# Patient Record
Sex: Male | Born: 1960 | Race: White | Hispanic: No | Marital: Married | Smoking: Never smoker
Health system: Southern US, Community
[De-identification: ages and names within clinical notes are randomized; demographics above are authoritative.]

---

## 2018-11-21 ENCOUNTER — Other Ambulatory Visit: Payer: Self-pay | Admitting: Internal Medicine

## 2018-11-21 DIAGNOSIS — R519 Headache, unspecified: Secondary | ICD-10-CM

## 2018-11-21 DIAGNOSIS — R51 Headache: Principal | ICD-10-CM

## 2018-12-07 ENCOUNTER — Ambulatory Visit: Admission: RE | Admit: 2018-12-07 | Payer: BLUE CROSS/BLUE SHIELD | Source: Ambulatory Visit

## 2020-05-09 ENCOUNTER — Other Ambulatory Visit: Payer: Self-pay | Admitting: Internal Medicine

## 2020-05-09 DIAGNOSIS — Z982 Presence of cerebrospinal fluid drainage device: Secondary | ICD-10-CM

## 2020-05-09 DIAGNOSIS — R29810 Facial weakness: Secondary | ICD-10-CM

## 2020-05-09 DIAGNOSIS — H539 Unspecified visual disturbance: Secondary | ICD-10-CM

## 2020-05-09 DIAGNOSIS — Z86018 Personal history of other benign neoplasm: Secondary | ICD-10-CM

## 2020-05-22 ENCOUNTER — Ambulatory Visit
Admission: RE | Admit: 2020-05-22 | Discharge: 2020-05-22 | Disposition: A | Discharge status: Home / Self Care | Payer: 59 | Source: Ambulatory Visit | Attending: Internal Medicine | Admitting: Internal Medicine

## 2020-05-22 ENCOUNTER — Other Ambulatory Visit: Payer: Self-pay

## 2020-05-22 DIAGNOSIS — Z9889 Other specified postprocedural states: Secondary | ICD-10-CM | POA: Diagnosis present

## 2020-05-22 DIAGNOSIS — Z86018 Personal history of other benign neoplasm: Secondary | ICD-10-CM | POA: Insufficient documentation

## 2020-05-22 DIAGNOSIS — Z982 Presence of cerebrospinal fluid drainage device: Secondary | ICD-10-CM | POA: Diagnosis present

## 2020-05-22 DIAGNOSIS — H539 Unspecified visual disturbance: Secondary | ICD-10-CM | POA: Diagnosis present

## 2020-05-22 DIAGNOSIS — R29810 Facial weakness: Secondary | ICD-10-CM | POA: Diagnosis present

## 2020-07-02 LAB — EXTERNAL GENERIC LAB PROCEDURE: COLOGUARD: NEGATIVE

## 2020-10-07 IMAGING — MR MR HEAD W/O CM
11 series · 46 of 48 positions shown · non-contrast
Comparison: None.

CLINICAL DATA: History of acoustic neuroma resection. Shunt. Visual
disturbance. Craniotomy 5986.

EXAM:
MRI HEAD WITHOUT CONTRAST
TECHNIQUE: Multiplanar, multiecho pulse sequences of the brain and surrounding
structures were obtained without intravenous contrast.

[Series 5: ax dwi_tracew · axial · 3.0mm · 0.60mm/px · z∈[-127,+27]mm · 3 of 48 slices shown]
[im 1/48]
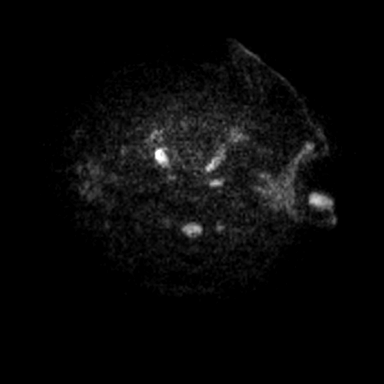
[im 24/48]
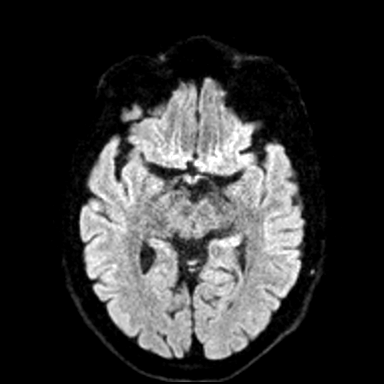
[im 48/48]
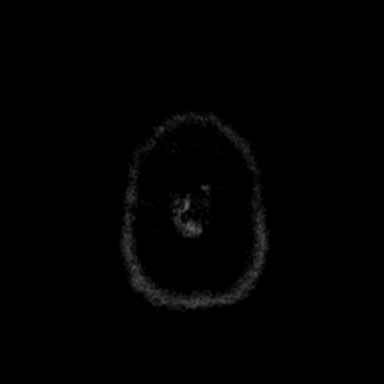

[Series 6: ax dwi_adc · axial · 3.0mm · 0.60mm/px · z∈[-127,+27]mm · 4 of 48 slices shown]
[im 1/48]
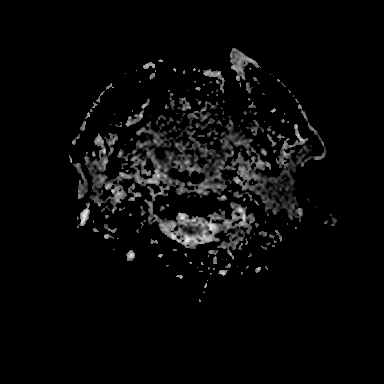
[im 16/48]
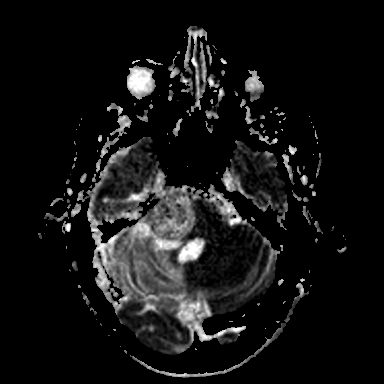
[im 32/48]
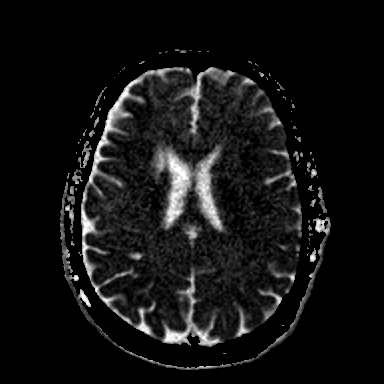
[im 48/48]
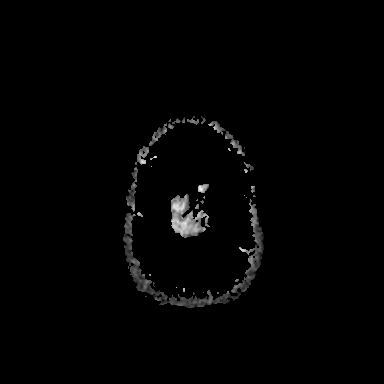

[Series 8: cor dwi_adc · coronal · 5.0mm · 0.60mm/px · 3 of 36 slices shown]
[im 1/36]
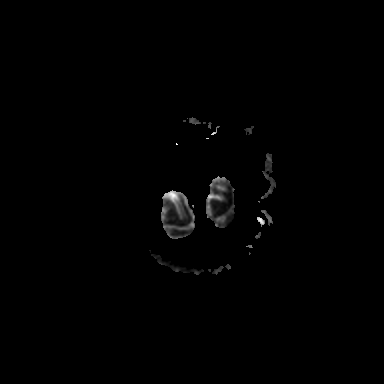
[im 18/36]
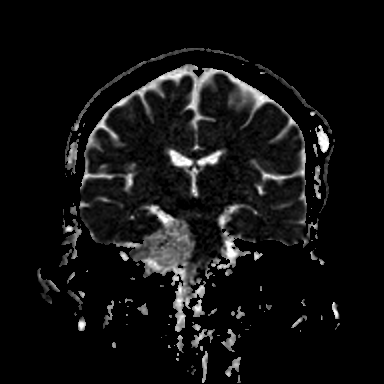
[im 36/36]
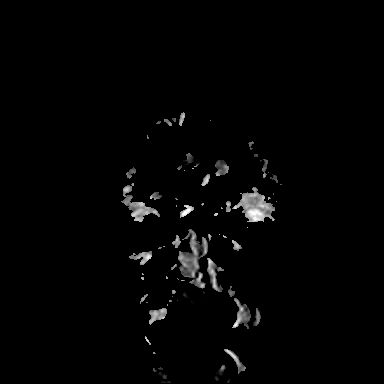

[Series 9: T1 · sagittal · 5.0mm · 0.62mm/px · 2 of 21 slices shown (1 of 2)]
[im 1/21]
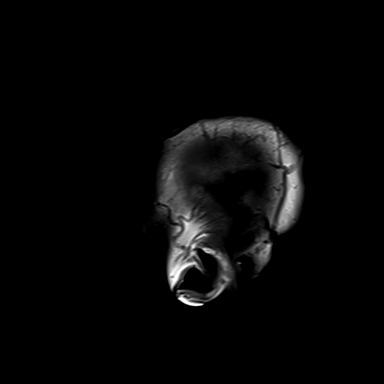
[im 21/21]
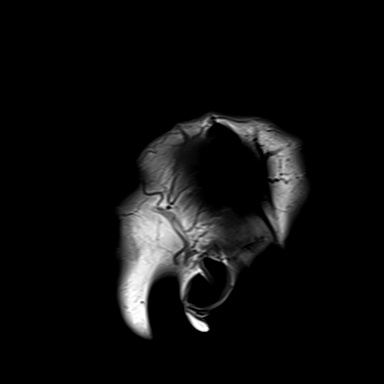

[Series 10: T2 · axial · 5.0mm · 0.53mm/px · z∈[-119,+24]mm · 2 of 25 slices shown (1 of 2)]
[im 1/25]
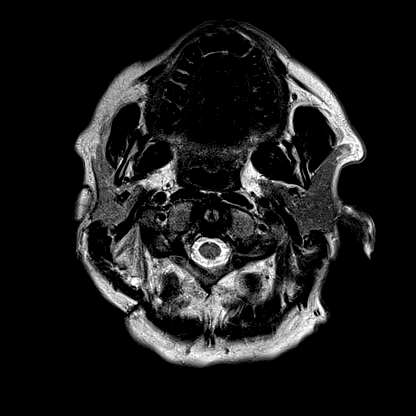
[im 25/25]
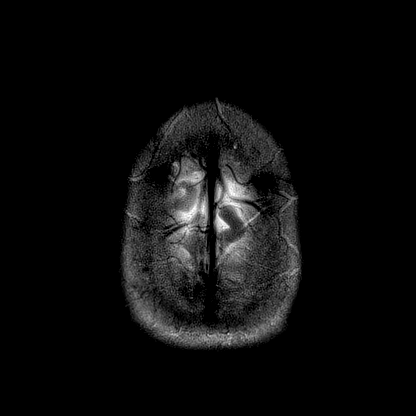

[Series 11: mag_images · axial · 3.0mm · 0.90mm/px · z∈[-135,+41]mm · 5 of 60 slices shown]
[im 1/60]
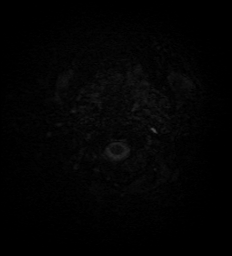
[im 15/60]
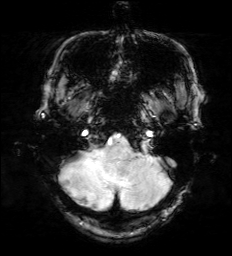
[im 30/60]
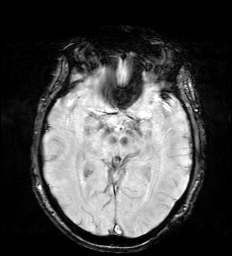
[im 45/60]
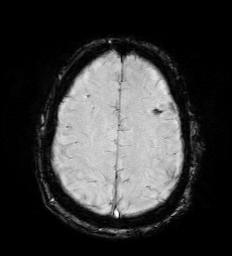
[im 60/60]
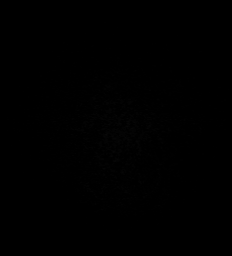

[Series 12: pha_images · axial · 3.0mm · 0.90mm/px · z∈[-135,+41]mm · 5 of 59 slices shown]
[im 1/59]
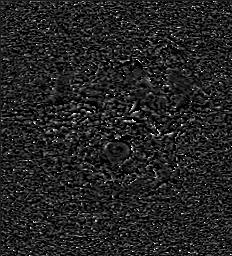
[im 15/59]
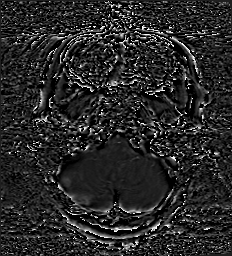
[im 30/59]
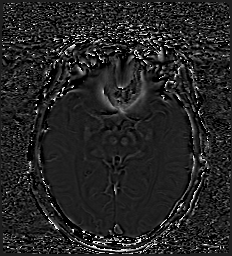
[im 44/59]
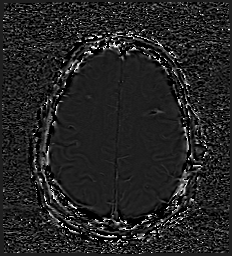
[im 59/59]
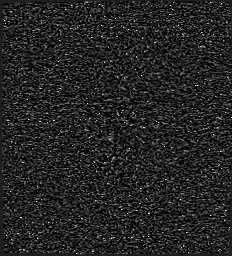

[Series 13: swi_images · axial · 3.0mm · 0.90mm/px · z∈[-135,+41]mm · 5 of 60 slices shown]
[im 1/60]
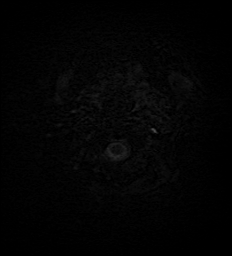
[im 15/60]
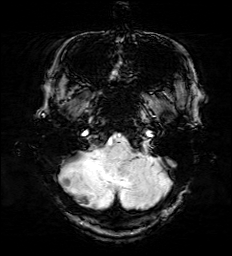
[im 30/60]
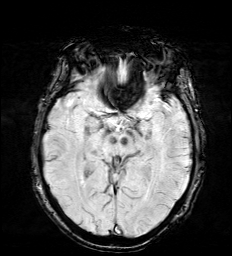
[im 45/60]
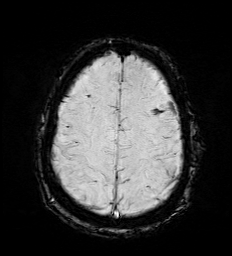
[im 60/60]
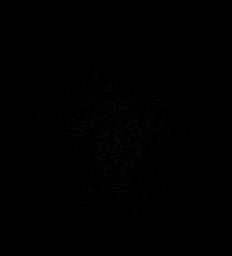

[Series 15: FLAIR · axial · 3.0mm · 0.53mm/px · z∈[-128,+33]mm · 4 of 55 slices shown]
[im 1/55]
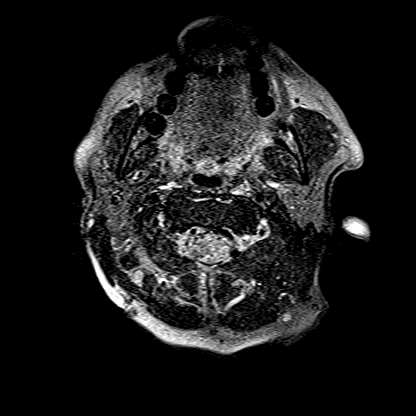
[im 19/55]
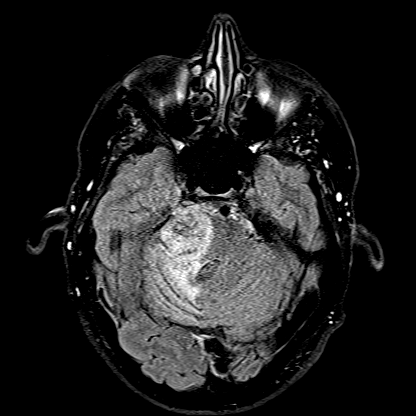
[im 37/55]
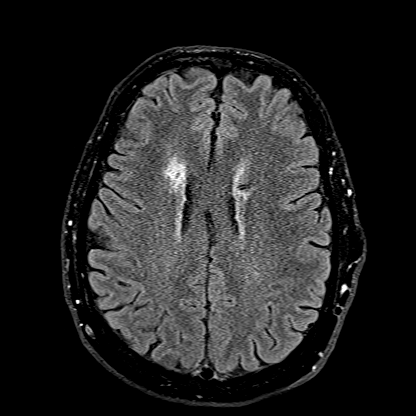
[im 55/55]
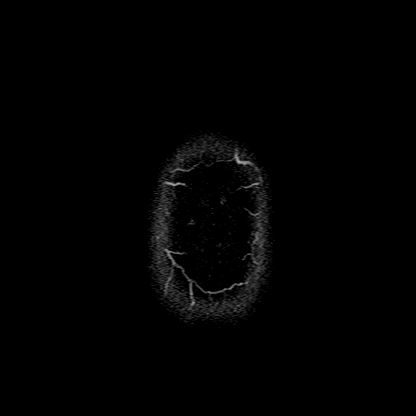

[Series 16: T1 · axial · 1.0mm · 0.98mm/px · z∈[-126,+32]mm · 11 of 160 slices shown (2 of 2)]
[im 1/160]
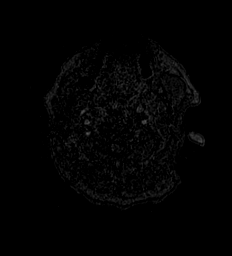
[im 14/160]
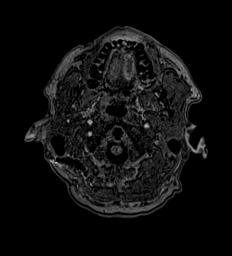
[im 27/160]
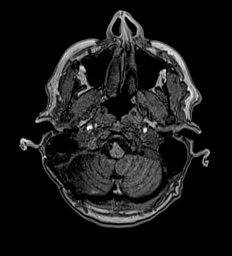
[im 40/160]
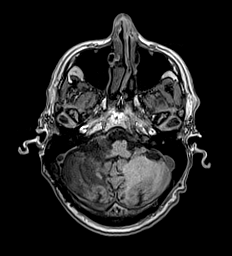
[im 54/160]
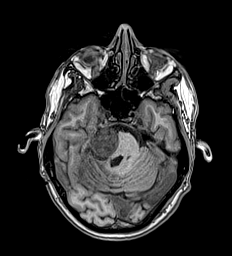
[im 67/160]
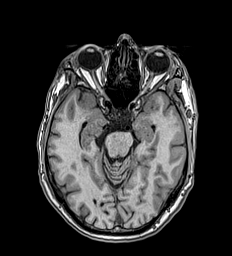
[im 80/160]
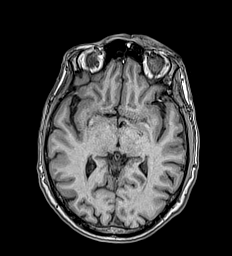
[im 93/160]
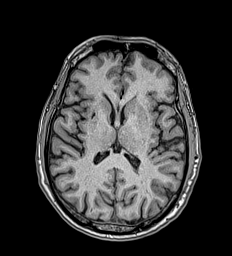
[im 107/160]
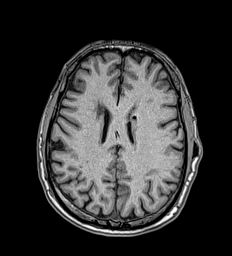
[im 133/160]
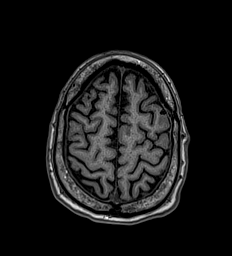
[im 160/160]
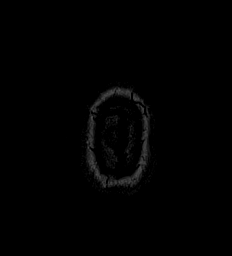

[Series 17: T2 · coronal · 5.0mm · 0.57mm/px · 2 of 27 slices shown (2 of 2)]
[im 1/27]
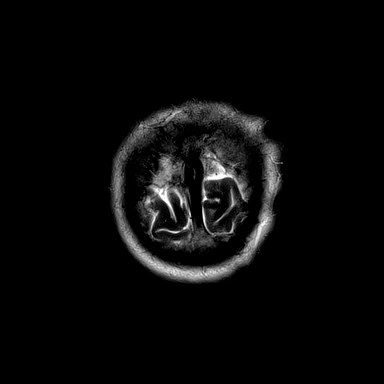
[im 27/27]
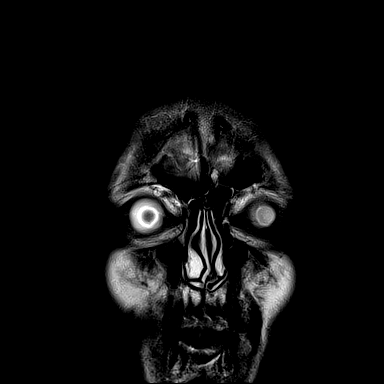

[46 of 48 positions shown; findings below may reference images not displayed]

FINDINGS: Brain: Large mass lesion is present in the right cerebellar pontine
angle cistern. This mass measures approximately 2.3 x 2.8 cm and
contains calcification. There is considerable mass-effect on the
pons which is flattened and displaced to the left. Mass is seen
extending into the right internal auditory canal. There is been
prior right occipital craniotomy. There is chronic encephalomalacia
throughout much of the right cerebellum.

Left frontal shunt catheter in the left lateral ventricle. Ventricle
size normal. Retired shunt tract in the right frontal lobe. Mild
chronic white matter ischemia. No acute infarct.

Vascular: Normal arterial flow voids

Skull and upper cervical spine: Right occipital craniotomy. No acute
skeletal abnormality.

Sinuses/Orbits: Mild mucosal edema paranasal sinuses. Mild right
mastoid effusion. Negative orbit

Other: None
IMPRESSION: 2.3 x 2.8 cm mass in the right cerebellar pontine angle cistern
extending into the right IAC consistent with vestibular schwannoma.
There is extensive mass-effect on the pons without significant edema
in the pons. There is extensive encephalomalacia in the right
cerebellum from prior surgery.

Shunt catheter in the left lateral ventricle.  No hydrocephalus.

## 2020-10-23 ENCOUNTER — Ambulatory Visit: Payer: 59 | Attending: Emergency Medicine | Admitting: Physical Therapy

## 2020-10-23 ENCOUNTER — Other Ambulatory Visit: Payer: Self-pay

## 2020-10-23 DIAGNOSIS — R41844 Frontal lobe and executive function deficit: Secondary | ICD-10-CM | POA: Diagnosis present

## 2020-10-23 DIAGNOSIS — R2689 Other abnormalities of gait and mobility: Secondary | ICD-10-CM | POA: Insufficient documentation

## 2020-10-23 DIAGNOSIS — M25611 Stiffness of right shoulder, not elsewhere classified: Secondary | ICD-10-CM | POA: Diagnosis present

## 2020-10-23 DIAGNOSIS — R29818 Other symptoms and signs involving the nervous system: Secondary | ICD-10-CM | POA: Insufficient documentation

## 2020-10-23 DIAGNOSIS — R41842 Visuospatial deficit: Secondary | ICD-10-CM | POA: Diagnosis present

## 2020-10-23 DIAGNOSIS — R41840 Attention and concentration deficit: Secondary | ICD-10-CM | POA: Diagnosis present

## 2020-10-23 DIAGNOSIS — M25511 Pain in right shoulder: Secondary | ICD-10-CM | POA: Diagnosis present

## 2020-10-23 DIAGNOSIS — R42 Dizziness and giddiness: Secondary | ICD-10-CM

## 2020-10-23 DIAGNOSIS — R2681 Unsteadiness on feet: Secondary | ICD-10-CM | POA: Insufficient documentation

## 2020-10-23 DIAGNOSIS — M6281 Muscle weakness (generalized): Secondary | ICD-10-CM | POA: Diagnosis present

## 2020-10-23 DIAGNOSIS — R278 Other lack of coordination: Secondary | ICD-10-CM | POA: Insufficient documentation

## 2020-10-23 DIAGNOSIS — R262 Difficulty in walking, not elsewhere classified: Secondary | ICD-10-CM | POA: Diagnosis present

## 2020-10-23 NOTE — Therapy (Addendum)
[facility] [address] [place], [place], [address] Phone: [phone]   Fax:  [phone]  Physical Therapy Evaluation  Patient Details  Name: [name] MRN: [id number] Date of Birth: 1961-04-11 Referring Provider (PT): Nelwyn Salisbury, [place]   Encounter Date: 10/23/2020   PT End of Session - 10/23/20 1707    Visit Number 1    Number of Visits 13    Authorization Type [employer] - 30 VL between PT and OT (will start over at beginning of year)    Authorization - Number of Visits 30    PT Start Time 1448    PT Stop Time 1540    PT Time Calculation (min) 52 min    Equipment Utilized During Treatment Gait belt    Activity Tolerance Patient tolerated treatment well    Behavior During Therapy [facility] for tasks assessed/performed           No past medical history on file.    There were no vitals filed for this visit.    Subjective Assessment - 10/23/20 1456    Subjective Pt with right acoustic neuroma, s/p partial resection and VP shunt placement in 2013 (pt reporting having a CVA afterwards and had to learn to walk/speak again - unable to see these records as pt was in [place]), followed by radiation therapy in 2017, presenting for repeat resection due to worsening diplopia.  Moved to [place] in 2018. On 10/08/20 underwent resection of R acoustic neuroma (all of it was removed).  Pt reports doing well after the surgery, reports balance is much better. Before the surgery was having incr double vision. Having surgery on his eyelid on the 13th of December to help it close better.  Reports occassional dizziness, more in stressful situations(like in a busy crowd). No falls. Reports difficulties with fine motor skills with R hand - wishes to be able to use his R hand again.    Patient is accompained by: Family member   wife, [name]   Pertinent History R acoustic neuroma (s/p resection on 10/08/20), past medical history of PE  (in 2016, off DOAC) hypercholesteremia, hypertension    Patient Stated Goals wants to be able to move better and walk better, more fluid pattern.    Currently in Pain? 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  No    Is the patient reluctant to leave their home because of a fear of falling?  No      Home Tourist information centre manager residence    Living Arrangements Spouse/significant other;Other (Comment)   and 60 year old grandduaghter   Type of Home House  Home Access Stairs to enter    Entrance Stairs-Number of Steps 5    Entrance Stairs-Rails Can reach both;Right   R side getting up in garage   Home Layout Two level    Alternate Level Stairs-Number of Steps --   standard flight of stairs   Alternate Level Stairs-Rails Left    Home Equipment Other (comment)   stool in shower (has not needed it)     Prior Function   Level of Independence Independent    Vocation Requirements 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)    Leisure gardening, yard work       Nutritional therapist   Gross Motor Movements are Fluid and Coordinated No    Psychiatric nurse difficulty with RLE>LLE      ROM / Strength   AROM / PROM / Strength Strength      Strength    Strength Assessment Site Hip;Knee;Ankle    Right/Left Hip Right;Left    Right Hip Flexion 5/5    Left Hip Flexion 5/5    Right/Left Knee Right;Left    Right Knee Flexion 5/5    Right Knee Extension 5/5    Left Knee Flexion 4+/5    Left Knee Extension 5/5    Right/Left Ankle Right;Left    Right Ankle Dorsiflexion 5/5    Left Ankle Dorsiflexion 5/5      Transfers   Transfers Sit to Stand;Stand to Sit    Sit to Stand 5: Supervision    Five time sit to stand comments  with no UE support:  16.34 seconds     Stand to Sit 5: Supervision      Ambulation/Gait   Ambulation/Gait Yes    Ambulation/Gait Assistance 5: Supervision;4: Min guard    Ambulation/Gait Assistance Details needing assist on pt's R due to pt wearing eye patch and decr vision    Assistive device None    Gait Pattern Step-through pattern;Decreased arm swing - right;Decreased arm swing - left    Ambulation Surface Level;Indoor    Gait velocity 5.45 seconds in 20 ft = 3.66 ft/sec       High Level Balance   High Level Balance Comments mCTSIB (with feet together): condition 1-3: 30 seconds, condition 4: 2.3 seconds, SLS: 2 seconds B      Functional Gait  Assessment   Gait assessed  Yes    Gait Level Surface Walks 20 ft in less than 5.5 sec, no assistive devices, good speed, no evidence for imbalance, normal gait pattern, deviates no more than 6 in outside of the 12 in walkway width.   5.45   Change in Gait Speed Able to smoothly change walking speed without loss of balance or gait deviation. Deviate no more than 6 in outside of the 12 in walkway width.    Gait with Horizontal Head Turns Performs head turns smoothly with slight change in gait velocity (eg, minor disruption to smooth gait path), deviates 6-10 in outside 12 in walkway width, or uses an assistive device.    Gait with Vertical Head Turns Performs task with slight change in gait velocity (eg, minor disruption to smooth gait path), deviates 6 - 10 in outside 12 in  walkway width or uses assistive device    Gait and Pivot Turn Pivot turns safely within 3 sec and stops quickly with no loss of balance.    Step  Over Obstacle Is able to step over 2 stacked shoe boxes taped together (9 in total height) without changing gait speed. No evidence of imbalance.    Gait with Narrow Base of Support Ambulates less than 4 steps heel to toe or cannot perform without assistance.    Gait with Eyes Closed Walks 20 ft, slow speed, abnormal gait pattern, evidence for imbalance, deviates 10-15 in outside 12 in walkway width. Requires more than 9 sec to ambulate 20 ft.   veers to R   Ambulating Backwards Walks 20 ft, uses assistive device, slower speed, mild gait deviations, deviates 6-10 in outside 12 in walkway width.    Steps Alternating feet, must use rail.    Total Score 21    FGA comment: 21/30                      Objective measurements completed on examination: See above findings.               PT Education - 10/23/20 1707    Education Details clinical findings, POC, fall risk.    Person(s) Educated Patient;Spouse    Methods Explanation    Comprehension Verbalized understanding            PT Short Term Goals - 10/24/20 0939      PT SHORT TERM GOAL #1   Title Pt will be independent with initial HEP in order to build upon functional gains made in therapy. ALL STGS DUE 11/14/20    Time 3    Period Weeks    Status New    Target Date 11/14/20      PT SHORT TERM GOAL #2   Title Pt will undergo further assessment of SOT - with LTG written as appropriate.    Baseline 3    Period Weeks    Status New      PT SHORT TERM GOAL #3   Title Pt will improve FGA score to at least a 23/30 in order to demo decr fall risk.    Baseline 21/30    Time 3    Period Weeks    Status New      PT SHORT TERM GOAL #4   Title Pt will improve 5x sit <> stand to 13 seconds or less in order to demo improved BLE strength.    Baseline 16.34 seconds    Time  3    Period Weeks    Status New      PT SHORT TERM GOAL #5   Title Pt will perform 8 steps with no handrail and reciprocal pattern with supervision in order to demo improved BLE strength and balance    Baseline 3    Period Weeks    Status New             PT Long Term Goals - 10/24/20 0941      PT LONG TERM GOAL #1   Title Pt will be independent with final HEP in order to build upon functional gains made in therapy    Time 6    Period Weeks    Status New    Target Date 12/05/20      PT LONG TERM GOAL #2   Title SOT goal to be written as appropriate.    Time 6    Period Weeks    Status New      PT LONG TERM GOAL #3   Title Pt will improve FGA score to at least  a 25/30 in order to demo decr fall risk.    Baseline 21/30    Time 6    Period Weeks    Status New      PT LONG TERM GOAL #4   Title Pt will improve condition 4 of mCTSIB to at least 10 seconds to demo improved vestibular input for balance.    Baseline 2 seconds.    Time 6    Period Weeks    Status New      PT LONG TERM GOAL #5   Title Pt will ambulate at least 500' outdoors over unlevel surfaces with supervision in order to demo improved community mobility.    Time 6    Period Weeks    Status New                  Plan - 10/24/20 0933    Clinical Impression Statement Patient is a 59 year old male referred to Neuro OPPT s/p repeat resection of R acoustic neuroma. 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 The following deficits were present during the exam: impaired vision with R eye (not formally assessed, pt still wearing eye patch), decr functional BLE strength, dynamic balance impairments, impaired SLS, decr vestibular input for balance. Pt reports occasional dizziness (none today and not formally assessed, but does  report it is worse in a busy environment). Based on FGA score pt is at a moderate risk for falls. Pt able to perform condition 4 of mCTSIB for 2 seconds indicating decr vestibular input for balance. Prior to most recent resection, pt was still working and flipping houses. Pt would benefit from skilled PT to address these impairments and functional limitations to maximize functional mobility independence    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), past medical history of PE (in 2016, off DOAC) hypercholesteremia, hypertension    Examination-Activity Limitations Locomotion Level;Stairs;Transfers    Examination-Participation Restrictions Occupation;Community Activity;Yard Work   Estate manager/land agent Evolving/Moderate complexity    Clinical Decision Making Moderate    Rehab Potential Good    PT Frequency 2x / week    PT Duration 6 weeks    PT Treatment/Interventions ADLs/Self Care Home Management;Gait training;Stair training;Functional mobility training;Therapeutic activities;Therapeutic exercise;Balance training;Neuromuscular re-education;Patient/family education;Vestibular;Visual/perceptual remediation/compensation    PT Next Visit Plan initial HEP for balance - corner balance for vestibular input, SLS, tandem gait/marching. perform SOT.  high level balance over compliant surfaces, functional BLE strength.    Consulted and Agree with Plan of Care Patient   wife [name]          Patient will benefit from skilled therapeutic intervention in order to improve the following deficits and impairments:  Abnormal gait, Decreased balance, Decreased activity tolerance, Decreased coordination, Decreased strength, Impaired vision/preception, Difficulty walking, Dizziness  Visit Diagnosis: Unsteadiness on feet  Other abnormalities of gait and mobility  Other symptoms and signs  involving the nervous system  Difficulty in walking, not elsewhere classified  Dizziness and giddiness     Problem List There are no problems to display for this patient.   Drake Leach, PT, DPT  10/24/2020, 9:50 AM  [facility] [address] [place], [place], [address] Phone: [phone]   Fax:  [phone]  Name: Ester Mabe MRN: [id number] Date of Birth: Mar 31, 1961

## 2020-10-24 NOTE — Addendum Note (Signed)
Addended by: Drake Leach on: 10/24/2020 09:51 AM   Modules accepted: Orders

## 2020-10-28 ENCOUNTER — Ambulatory Visit: Payer: 59 | Admitting: Occupational Therapy

## 2020-10-28 ENCOUNTER — Other Ambulatory Visit: Payer: Self-pay

## 2020-10-28 ENCOUNTER — Encounter: Payer: Self-pay | Admitting: Physical Therapy

## 2020-10-28 ENCOUNTER — Ambulatory Visit: Payer: 59 | Admitting: Physical Therapy

## 2020-10-28 ENCOUNTER — Encounter: Payer: Self-pay | Admitting: Occupational Therapy

## 2020-10-28 DIAGNOSIS — R41842 Visuospatial deficit: Secondary | ICD-10-CM

## 2020-10-28 DIAGNOSIS — R29818 Other symptoms and signs involving the nervous system: Secondary | ICD-10-CM

## 2020-10-28 DIAGNOSIS — R278 Other lack of coordination: Secondary | ICD-10-CM

## 2020-10-28 DIAGNOSIS — M6281 Muscle weakness (generalized): Secondary | ICD-10-CM

## 2020-10-28 DIAGNOSIS — R41844 Frontal lobe and executive function deficit: Secondary | ICD-10-CM

## 2020-10-28 DIAGNOSIS — R2689 Other abnormalities of gait and mobility: Secondary | ICD-10-CM

## 2020-10-28 DIAGNOSIS — R262 Difficulty in walking, not elsewhere classified: Secondary | ICD-10-CM

## 2020-10-28 DIAGNOSIS — M25511 Pain in right shoulder: Secondary | ICD-10-CM

## 2020-10-28 DIAGNOSIS — R2681 Unsteadiness on feet: Secondary | ICD-10-CM | POA: Diagnosis not present

## 2020-10-28 DIAGNOSIS — R41840 Attention and concentration deficit: Secondary | ICD-10-CM

## 2020-10-28 DIAGNOSIS — M25611 Stiffness of right shoulder, not elsewhere classified: Secondary | ICD-10-CM

## 2020-10-28 NOTE — Therapy (Signed)
[facility] [address] [place], [place], [address] Phone: [phone]   Fax:  [phone]  Occupational Therapy Evaluation  Patient Details  Name: Jerome ParisRichard Morrison MRN: [id number] Date of Birth: Jun 08, 1961 No data recorded  Encounter Date: 10/28/2020   OT End of Session - 10/28/20 1347    Visit Number 1    Number of Visits 13    Date for OT Re-Evaluation 12/09/20    Authorization Type [employer]    Authorization Time Period VL 30 combined PT/OT    OT Start Time 1310    OT Stop Time 1400    OT Time Calculation (min) 50 min    Activity Tolerance Patient tolerated treatment well    Behavior During Therapy [facility] for tasks assessed/performed           History reviewed. No pertinent past medical history.  History reviewed. No pertinent surgical history.  There were no vitals filed for this visit.   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 Pt with PMH of PE in 2016, hypercholesteremia, HTN, and partial resection and VP shunt placement 2013. Pt reports not working since 2008. Pt reports "milky" vision in the right eye however pt presented to evaluation with R eye occluded this day secondary to extreme dryness and ptosis. Pt received botox 12/3 above right eye to assist with ptosis. Pt reports scheduled surgery for right eye on 12/13. 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    Patient is accompanied by: Family member   [name], spouse   Pertinent History PMH hypercholesteremia, HTN, Acoustic Neuroma, VP shunt, Bell's Palsy    Limitations Fall Risk    Patient Stated Goals using tool like a hammer    Currently in Pain? Yes    Pain Score 7     Pain Location Shoulder    Pain Orientation Right    Pain Descriptors / Indicators Sharp;Sore    Pain Type Acute pain    Pain Onset More than  a month ago    Pain Frequency Intermittent    Aggravating Factors  lifting (shoulder flexion and abduction)             [facility] OT Assessment - 10/28/20 1312      Assessment   Medical Diagnosis Acoustic Neuroma     Onset Date/Surgical Date 09/29/11   Jan 2013 sx vision loss Nov 2012   Hand Dominance Right    [facility]      Precautions   Precautions Fall;Other (comment)   shunt    Precaution Comments deaf in right ear, vision deficits      Balance Screen   Has the patient fallen in the past 6 months No      Home  Environment   Family/patient expects to be discharged to: Private residence    Living Arrangements Spouse/significant other   Jerome DupreRose (granddaughter)   Type of Home House    Home Access Stairs   5   Home Layout Able to live on main level with bedroom/bathroom    Bathroom Shower/Tub Walk-in Whole FoodsShower    Home Equipment Shower seat      Prior Function   Level of Independence Independent   prior to most recent sx   Vocation Unemployed    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 Clinical reasoning suggests that d/t balance and instability, supervision or some hands on assistance may be recommended or required.      IADL   Prior Level of Function Shopping independent    Shopping Completely unable to shop    Prior Level of Function Light  Housekeeping did not complete prior    Light Housekeeping Needs help with all home maintenance tasks    Prior Level of Function Meal Prep did not complete prior    Meal Prep Needs to have meals prepared and served    Prior Level of Function Community Mobility was driving prior    Engineer, drilling on family or friends for transportation    Prior Level of Function Medication Managment independent - not many medications    Medication Management Has difficulty remembering to take medication    Prior Level of Function Financial Management independent prior    Development worker, community financial matters independently (budgets, writes checks, pays rent, bills goes to bank), collects and keeps track of income      Mobility   Mobility Status Comments pt ambulated into therapy clinic/gym with hand holding spouse - no assistive device       Written Expression   Dominant Hand Right    Handwriting 50% legible;Increased time    Written Experience Exceptions to [facility]      Vision - History   Baseline Vision Other (comment)    Visual History Other (comment)    Additional Comments Pt wore readers prior to all surgeries and resections. Currently still wearing readers and has Rt eye occluded d/t extreme eye dryness in R eye and to keep clean d/t ptosis - botox injections 12/3. Pt reports varying degrees of visual loss and diplopia with tumor. Pt is scheduled for lid weight surgery 12/13 for right eye ptosis.       Vision Assessment   Comment continue to assess as pt removes occlusion, receives eyelid weight 12/13 and botox has time to take effect      Cognition   Overall Cognitive Status Impaired/Different from baseline    Awareness Impaired    Behaviors Restless;Impulsive    Cognition Comments continue to assess in functional context and set goal PRN      Sensation   Light Touch Appears Intact      Coordination   Finger Nose Finger Test impaired right    9 Hole Peg Test Right;Left     Right 9 Hole Peg Test 60.16    Left 9 Hole Peg Test 28.65    Box and Blocks RUE 27, LUE 49      Perception   Perception Impaired      ROM / Strength   AROM / PROM / Strength AROM;Strength      AROM   Overall AROM  Deficits    Overall AROM Comments RUE deficits; LUE WFL    AROM Assessment Site Shoulder  Right/Left Shoulder Right    Right Shoulder Flexion 115 Degrees   w/ pain 7/10   Right Shoulder ABduction 95 Degrees   w/ pain 7/10     Strength   Overall Strength Comments RUE deficits, LUE WFL    Strength Assessment Site Shoulder;Elbow    Right/Left Shoulder Right    Right Shoulder Flexion 4/5    Right Shoulder Extension 4/5    Right Shoulder ABduction 4/5    Right/Left Elbow Right    Right Elbow Flexion 4/5    Right Elbow Extension 4/5      Hand Function   Right Hand Gross Grasp Functional    Right Hand Grip (lbs) 65.2    Left Hand Gross Grasp Functional    Left Hand Grip (lbs) 74.2                             OT Short Term Goals - 10/28/20 1527      OT SHORT TERM GOAL #1   Title Pt will be independent with HEP    Time 3    Period Weeks    Status New    Target Date 12/09/20      OT SHORT TERM GOAL #2   Title Pt will demonstrate increased fine motor coordination as evidenced by increasing 9 hole peg test score by at least 5 seconds with RUE.    Baseline RUE 60.16    Time 3    Period Weeks    Status New      OT SHORT TERM GOAL #3   Title Pt will complete environmental scanning with 80% accuracy with partial occlusion PRN and decrease in reports of diplopia.    Time 3    Period Weeks    Status New      OT SHORT TERM GOAL #4   Title Pt will verbalize understanding of visual strategies for diplopia other visual disturbances    Time 3    Period Weeks    Status New      OT SHORT TERM GOAL #5   Title Pt will increase range of motion in RUE shoulder to at least 120 degrees in order to obtain item from overhead shelf with pain less than or  equal to 6/10    Baseline pain 7/10 115 degrees    Time 3    Period Weeks    Status New             OT Long Term Goals - 10/28/20 1534      OT LONG TERM GOAL #1   Title Pt will be independent with updated HEP 12/09/2020    Time 6    Period Weeks    Status New    Target Date 12/09/20      OT LONG TERM GOAL #2   Title Pt will report increase ease with use of hand held tools (i.e. hammer) with RUE, dominant hand and with mod I    Time 6    Period Weeks    Status New      OT LONG TERM GOAL #3   Title Pt will demonstrate increased fine motor coordination with increased 9 hole peg test score of at least 10 seconds    Baseline RUE 60.16    Time 6    Period Weeks    Status New      OT LONG TERM GOAL #4   Title Pt will perform Box and  Blocks test of 32 blocks or more with RUE for increase in functional use.    Baseline RUE 27    Time 6    Period Weeks    Status New      OT LONG TERM GOAL #5   Title Pt will perform handwriting of one sentence with legibility of 90% and in appropriate time with right, dominant, hand.    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 Pt with PMH of PE in 2016, hypercholesteremia, HTN, and partial resection and VP shunt placement 2013. Pt presents with deficits with vision, RUE weakness and RUE range of motion including fine motor coordination deficits and overall gross motor coordination with RUE. Skilled OT is recommended to target these deficits and increase independence and decrease caregiver burden.    OT  Occupational Profile and History Detailed Assessment- Review of Records and additional review of physical, cognitive, psychosocial history related to current functional performance    Occupational performance deficits (Please refer to evaluation for details): IADL's;ADL's;Social Participation;Rest and Sleep;Leisure;Education    Body Structure / Function / Physical Skills ADL;Balance;Coordination;FMC;Flexibility;Dexterity;Strength;Pain;GMC;UE functional use;ROM;IADL;Vision    Cognitive Skills Attention;Problem Solve;Thought;Learn;Understand;Perception    Rehab Potential Fair    Clinical Decision Making Limited treatment options, no task modification necessary    Comorbidities Affecting Occupational Performance: None    Modification or Assistance to Complete Evaluation  No modification of tasks or assist necessary to complete eval    OT Frequency 2x / week    OT Duration 6 weeks   or 12 visits + eval over extended time d/t scheduling conflicts   OT Treatment/Interventions Balance training;Moist Heat;Fluidtherapy;Self-care/ADL training;Therapeutic activities;Cognitive remediation/compensation;Therapeutic exercise;Electrical Stimulation;Cryotherapy;Energy conservation;Neuromuscular education;Functional Mobility Training;Passive range of motion;Visual/perceptual remediation/compensation;Patient/family education;Manual Therapy    Plan continue to assess vision and cognitive skills    Recommended Other Services Pt has referral for ST and is currently receiving PT    Consulted and Agree with Plan of Care Patient;Family member/caregiver    Family Member Consulted spouse, [name]           Patient will benefit from skilled therapeutic intervention in order to improve the following deficits and impairments:   Body Structure / Function / Physical Skills: ADL, Balance, Coordination, FMC, Flexibility, Dexterity, Strength, Pain, GMC, UE functional use, ROM, IADL, Vision Cognitive Skills: Attention, Problem  Solve, Thought, Learn, Understand, Perception     Visit Diagnosis: Visuospatial deficit - Plan: Ot plan of care cert/re-cert  Unsteadiness on feet - Plan: Ot plan of care cert/re-cert  Other lack of coordination - Plan: Ot plan of care cert/re-cert  Other abnormalities of gait and mobility - Plan: Ot plan of care cert/re-cert  Muscle weakness (generalized) - Plan: Ot plan of care cert/re-cert  Other symptoms and signs involving the nervous system - Plan: Ot plan of care cert/re-cert  Acute pain of right shoulder - Plan: Ot plan of  care cert/re-cert  Stiffness of right shoulder, not elsewhere classified - Plan: Ot plan of care cert/re-cert  Attention and concentration deficit - Plan: Ot plan of care cert/re-cert  Frontal lobe and executive function deficit - Plan: Ot plan of care cert/re-cert    Problem List There are no problems to display for this patient.   Junious Dresser MOT, OTR/L  10/28/2020, 5:24 PM  [facility] [address] [place], [place], [address] Phone: [phone]   Fax:  [phone]  Name: [name] MRN: [id number] Date of Birth: 27-Jan-1961

## 2020-10-28 NOTE — Patient Instructions (Signed)
Access Code: [email or web address] URL: [email or web address] Date: 10/28/2020 Prepared by: Sherlie Ban  Exercises Standing Single Leg Stance with Counter Support - 1 x daily - 7 x weekly - 3 sets - 10 reps Tandem Walking with Counter Support - 2 x daily - 5 x weekly - 3 sets Standing Marching - 2 x daily - 5 x weekly - 3 sets Tandem Stance with Support - 2 x daily - 5 x weekly - 3 sets - 20-30 hold Wide Stance with Eyes Closed on Foam Pad - 2 x daily - 5 x weekly - 3 sets - 30 hold Wide Stance with Eyes Closed and Head Rotation on Foam Pad - 1 x daily - 5 x weekly - 2 sets - 10 reps

## 2020-10-28 NOTE — Therapy (Signed)
[facility] [address] [place], [place], [address] Phone: [phone]   Fax:  [phone]  Physical Therapy Treatment  Patient Details  Name: [name] MRN: [id number] Date of Birth: 1961/09/16 Referring Provider (PT): Nelwyn [place], [place]   Encounter Date: 10/28/2020   PT End of Session - 10/28/20 1444    Visit Number 2    Number of Visits 13    Authorization Type [employer] - 30 VL between PT and OT (will start over at beginning of year)    Authorization - Visit Number 1    Authorization - Number of Visits 30    PT Start Time 1359    PT Stop Time 1441    PT Time Calculation (min) 42 min    Equipment Utilized During Treatment Gait belt    Activity Tolerance Patient tolerated treatment well    Behavior During Therapy [facility] for tasks assessed/performed           History reviewed. No pertinent past medical history.  History reviewed. No pertinent surgical history.  There were no vitals filed for this visit.   Subjective Assessment - 10/28/20 1401    Subjective No falls. Going to get his eyelid surgery next week.    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    Currently in Pain? No/denies                      Access Code: [email or web address] URL: [email or web address] Date: 10/28/2020 Prepared by: Sherlie Ban  Initiated HEP - see MedBridge for more details.   Exercises Standing Single Leg Stance with Counter Support - 1 x daily - 7 x weekly - 3 sets - 10 reps Tandem Walking with Counter Support - 2 x daily - 5 x weekly - 3 sets Standing Marching - 2 x daily - 5 x weekly - 3 sets Tandem Stance with Support - 2 x daily - 5  x weekly - 3 sets - 20-30 hold Wide Stance with Eyes Closed on Foam Pad - 2 x daily - 5 x weekly - 3 sets - 30 hold Wide Stance with Eyes Closed and Head Rotation on Foam Pad - 1 x daily - 5 x weekly - 2 sets - 10 reps             Balance Exercises - 10/28/20 0001      Balance Exercises: Standing   Standing Eyes Opened Narrow base of support (BOS);Limitations    Standing Eyes Opened Limitations on single pillow x10 reps head turns, x10 reps head nods    Standing Eyes Closed Narrow base of support (BOS);Head turns;Solid surface;Limitations    Standing Eyes Closed Limitations feet together eyes closed x10 reps head turns, x10 reps head nods    Stepping Strategy Posterior;Foam/compliant surface;Limitations    Stepping Strategy Limitations on blue foam beam: alternating posterior stepping strategy x10 reps B with intermittent UE support    Sidestepping Upper extremity support;Foam/compliant support;Limitations    Sidestepping Limitations down and back on blue foam beam x3 reps, cues for foot clearance, intermittent UE support, incr difficulty with side stepping to L  PT Short Term Goals - 10/24/20 0939      PT SHORT TERM GOAL #1   Title Pt will be independent with initial HEP in order to build upon functional gains made in therapy. ALL STGS DUE 11/14/20    Time 3    Period Weeks    Status New    Target Date 11/14/20      PT SHORT TERM GOAL #2   Title Pt will undergo further assessment of SOT - with LTG written as appropriate.    Baseline 3    Period Weeks    Status New      PT SHORT TERM GOAL #3   Title Pt will improve FGA score to at least a 23/30 in order to demo decr fall risk.    Baseline 21/30    Time 3    Period Weeks    Status New      PT SHORT TERM GOAL #4   Title Pt will improve 5x sit <> stand to 13 seconds or less in order to demo improved BLE strength.    Baseline 16.34 seconds    Time 3    Period Weeks    Status New      PT SHORT  TERM GOAL #5   Title Pt will perform 8 steps with no handrail and reciprocal pattern with supervision in order to demo improved BLE strength and balance    Baseline 3    Period Weeks    Status New             PT Long Term Goals - 10/24/20 0941      PT LONG TERM GOAL #1   Title Pt will be independent with final HEP in order to build upon functional gains made in therapy    Time 6    Period Weeks    Status New    Target Date 12/05/20      PT LONG TERM GOAL #2   Title SOT goal to be written as appropriate.    Time 6    Period Weeks    Status New      PT LONG TERM GOAL #3   Title Pt will improve FGA score to at least a 25/30 in order to demo decr fall risk.    Baseline 21/30    Time 6    Period Weeks    Status New      PT LONG TERM GOAL #4   Title Pt will improve condition 4 of mCTSIB to at least 10 seconds to demo improved vestibular input for balance.    Baseline 2 seconds.    Time 6    Period Weeks    Status New      PT LONG TERM GOAL #5   Title Pt will ambulate at least 500' outdoors over unlevel surfaces with supervision in order to demo improved community mobility.    Time 6    Period Weeks    Status New                 Plan - 10/28/20 1546    Clinical Impression Statement Focus of today' skilled session was initiating HEP for balance with focus on vestibular input, narrow BOS, and SLS. Pt tolerated well. Remainder of session focused on balance strategies on compliant surfaces, with pt able to perform with decr UE support with each rep. Will continue to progress towards LTGs.    Personal Factors and Comorbidities Comorbidity 3+;Past/Current Experience;Profession  Comorbidities R acoustic neuroma (s/p resection on 10/08/20), past medical history of PE (in 2016, off DOAC) hypercholesteremia, hypertension    Examination-Activity Limitations Locomotion Level;Stairs;Transfers    Examination-Participation Restrictions Occupation;Community Activity;Yard Work    Estate manager/land agent Evolving/Moderate complexity    Rehab Potential Good    PT Frequency 2x / week    PT Duration 6 weeks    PT Treatment/Interventions ADLs/Self Care Home Management;Gait training;Stair training;Functional mobility training;Therapeutic activities;Therapeutic exercise;Balance training;Neuromuscular re-education;Patient/family education;Vestibular;Visual/perceptual remediation/compensation    PT Next Visit Plan corner balance for vestibular input, SLS, tandem gait/marching. perform SOT.  high level balance over compliant surfaces, functional BLE strength.    Consulted and Agree with Plan of Care Patient   wife [name]          Patient will benefit from skilled therapeutic intervention in order to improve the following deficits and impairments:  Abnormal gait, Decreased balance, Decreased activity tolerance, Decreased coordination, Decreased strength, Impaired vision/preception, Difficulty walking, Dizziness  Visit Diagnosis: Unsteadiness on feet  Other abnormalities of gait and mobility  Other symptoms and signs involving the nervous system  Difficulty in walking, not elsewhere classified     Problem List There are no problems to display for this patient.   Drake Leach, PT, DPT  10/28/2020, 3:47 PM  [facility] [address] [place], [place], [address] Phone: [phone]   Fax:  [phone]  Name: Tyreece Gelles MRN: [id number] Date of Birth: [birth year]

## 2020-10-30 ENCOUNTER — Other Ambulatory Visit: Payer: Self-pay

## 2020-10-30 ENCOUNTER — Encounter: Payer: Self-pay | Admitting: Physical Therapy

## 2020-10-30 ENCOUNTER — Ambulatory Visit: Payer: 59 | Admitting: Physical Therapy

## 2020-10-30 DIAGNOSIS — R278 Other lack of coordination: Secondary | ICD-10-CM

## 2020-10-30 DIAGNOSIS — R29818 Other symptoms and signs involving the nervous system: Secondary | ICD-10-CM

## 2020-10-30 DIAGNOSIS — R2689 Other abnormalities of gait and mobility: Secondary | ICD-10-CM

## 2020-10-30 DIAGNOSIS — R2681 Unsteadiness on feet: Secondary | ICD-10-CM | POA: Diagnosis not present

## 2020-10-30 DIAGNOSIS — M6281 Muscle weakness (generalized): Secondary | ICD-10-CM

## 2020-10-31 NOTE — Therapy (Signed)
[facility] [address] [place], [place], [address] Phone: [phone]   Fax:  [phone]  Physical Therapy Treatment  Patient Details  Name: [name] MRN: [id number] Date of Birth: 1961/01/16 Referring Provider (PT): [name], [place]   Encounter Date: 10/30/2020   PT End of Session - 10/31/20 0909    Visit Number 3    Number of Visits 13    Authorization Type [employer] - 30 VL between PT and OT (will start over at beginning of year)    Authorization - Visit Number 2    Authorization - Number of Visits 30    PT Start Time 1402    PT Stop Time 1442    PT Time Calculation (min) 40 min    Equipment Utilized During Treatment Gait belt    Activity Tolerance Patient tolerated treatment well    Behavior During Therapy [facility] for tasks assessed/performed           History reviewed. No pertinent past medical history.  History reviewed. No pertinent surgical history.  There were no vitals filed for this visit.   Subjective Assessment - 10/30/20 1404    Subjective Eyelid is doing better today. Did the exercises at home and they went well.    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    Currently in Pain? No/denies                             [facility] Adult PT Treatment/Exercise - 10/31/20 0001      Ambulation/Gait   Ambulation/Gait Yes    Ambulation/Gait Assistance 5: Supervision;4: Min guard    Ambulation/Gait Assistance Details throughout session    Assistive device None    Gait Pattern Step-through pattern;Decreased arm swing - right;Decreased arm swing - left    Ambulation Surface Level;Indoor                10/30/20  0001  Balance Exercises: Standing  Standing Eyes Closed Wide (BOA);Foam/compliant surface;Limitations  Standing Eyes Closed Limitations wider BOS eyes closed x30 seconds and then feet hip width distance 3 x 30 seconds with intermittent taps to the wall as needed for balance  SLS with Vectors Solid surface;Intermittent upper extremity assist;Limitations  SLS with Vectors Limitations with 2 cones, alternating gentle toe taps x15 reps B beginning with single UE support > fingertip > none, min guard/min A at times for balance  Partial Tandem Stance Eyes open;Limitations  Partial Tandem Stance Limitations with each leg posteriorly (stepping one foot in front narrow BOS but not tandem, 2 x 5 reps head turns and 2 x 5 reps head nods with each leg, intermittent touch to wall as needed for balance  Step Over Hurdles / Cones stepping over 3 smaller orange hurdles next to countertop, down and back 5 times, with intermittent UE support, cues for incr hip/knee flexion for foot clerance  Other Standing Exercises on blue air ex: heel toe raises x10 reps, lateral weight shifting x10 reps B and then a couple reps lateral weight shifting eyes closed with intermittent touch to the wall as needed for balance         PT Short Term  Goals - 10/24/20 0939      PT SHORT TERM GOAL #1   Title Pt will be independent with initial HEP in order to build upon functional gains made in therapy. ALL STGS DUE 11/14/20    Time 3    Period Weeks    Status New    Target Date 11/14/20      PT SHORT TERM GOAL #2   Title Pt will undergo further assessment of SOT - with LTG written as appropriate.    Baseline 3    Period Weeks    Status New      PT SHORT TERM GOAL #3   Title Pt will improve FGA score to at least a 23/30 in order to demo decr fall risk.    Baseline 21/30    Time 3    Period Weeks    Status New      PT SHORT TERM GOAL #4   Title Pt will improve 5x sit <> stand to 13 seconds or less in order to demo  improved BLE strength.    Baseline 16.34 seconds    Time 3    Period Weeks    Status New      PT SHORT TERM GOAL #5   Title Pt will perform 8 steps with no handrail and reciprocal pattern with supervision in order to demo improved BLE strength and balance    Baseline 3    Period Weeks    Status New             PT Long Term Goals - 10/24/20 0941      PT LONG TERM GOAL #1   Title Pt will be independent with final HEP in order to build upon functional gains made in therapy    Time 6    Period Weeks    Status New    Target Date 12/05/20      PT LONG TERM GOAL #2   Title SOT goal to be written as appropriate.    Time 6    Period Weeks    Status New      PT LONG TERM GOAL #3   Title Pt will improve FGA score to at least a 25/30 in order to demo decr fall risk.    Baseline 21/30    Time 6    Period Weeks    Status New      PT LONG TERM GOAL #4   Title Pt will improve condition 4 of mCTSIB to at least 10 seconds to demo improved vestibular input for balance.    Baseline 2 seconds.    Time 6    Period Weeks    Status New      PT LONG TERM GOAL #5   Title Pt will ambulate at least 500' outdoors over unlevel surfaces with supervision in order to demo improved community mobility.    Time 6    Period Weeks    Status New                 Plan - 10/31/20 0910    Clinical Impression Statement Focus of today's skilled session was balance strategies on compliant surfaces with vision removed, narrow BOS, and SLS activities. With eyes closed at times pt would sway more to the R, sometimes needing the wall at times to regain balance. Pt improving with balance with incr reps and needing decr external support. Will continue to progress towards LTGs.    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 perform SOT.  high level balance over compliant surfaces, functional BLE strength.    Consulted and Agree with Plan of Care Patient   wife [name]          Patient will benefit from skilled therapeutic intervention in order to improve the following deficits and impairments:  Abnormal gait,Decreased balance,Decreased activity tolerance,Decreased coordination,Decreased strength,Impaired vision/preception,Difficulty walking,Dizziness  Visit Diagnosis: Unsteadiness on feet  Other lack of coordination  Other abnormalities of gait and mobility  Muscle weakness (generalized)  Other symptoms and signs involving the nervous system     Problem List There are no problems to display for this patient.   Drake Leach, PT, DPT  10/31/2020, 9:14 AM  [facility] [address] [place], [place], [address] Phone: [phone]   Fax:  [phone]  Name: [name] MRN: [id number] Date of Birth: 11-Mar-1961

## 2020-11-05 ENCOUNTER — Ambulatory Visit: Payer: 59 | Admitting: Physical Therapy

## 2020-11-07 ENCOUNTER — Ambulatory Visit: Payer: 59 | Admitting: Occupational Therapy

## 2020-11-07 ENCOUNTER — Encounter: Payer: Self-pay | Admitting: Occupational Therapy

## 2020-11-07 ENCOUNTER — Ambulatory Visit: Payer: 59

## 2020-11-07 ENCOUNTER — Other Ambulatory Visit: Payer: Self-pay

## 2020-11-07 DIAGNOSIS — R278 Other lack of coordination: Secondary | ICD-10-CM

## 2020-11-07 DIAGNOSIS — R41842 Visuospatial deficit: Secondary | ICD-10-CM

## 2020-11-07 DIAGNOSIS — R2689 Other abnormalities of gait and mobility: Secondary | ICD-10-CM

## 2020-11-07 DIAGNOSIS — R2681 Unsteadiness on feet: Secondary | ICD-10-CM

## 2020-11-07 DIAGNOSIS — R262 Difficulty in walking, not elsewhere classified: Secondary | ICD-10-CM

## 2020-11-07 DIAGNOSIS — M6281 Muscle weakness (generalized): Secondary | ICD-10-CM

## 2020-11-07 NOTE — Therapy (Signed)
[facility] [address] [place], [place], [address] Phone: [phone]   Fax:  [phone]  Physical Therapy Treatment  Patient Details  Name: [name] MRN: [id number] Date of Birth: 1961-10-21 Referring Provider (PT): Nelwyn Salisbury, [place]   Encounter Date: 11/07/2020   PT End of Session - 11/07/20 1402    Visit Number 4    Number of Visits 13    Authorization Type [employer] - 30 VL between PT and OT (will start over at beginning of year)    Authorization - Visit Number 3    Authorization - Number of Visits 30    PT Start Time 1400    PT Stop Time 1443    PT Time Calculation (min) 43 min    Equipment Utilized During Treatment Gait belt    Activity Tolerance Patient tolerated treatment well    Behavior During Therapy [facility] for tasks assessed/performed           History reviewed. No pertinent past medical history.  History reviewed. No pertinent surgical history.  There were no vitals filed for this visit.   Subjective Assessment - 11/07/20 1402    Subjective Patient reports he has been taking it easy since surgery. No pain. MD cleared to participate, no heavy lifting and no bending.    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    Currently in Pain? No/denies               [facility] Adult PT Treatment/Exercise - 11/07/20 0001      Ambulation/Gait   Ambulation/Gait Yes    Ambulation/Gait Assistance 5: Supervision    Ambulation/Gait Assistance Details throughout sesion with activities    Assistive device None    Gait Pattern Step-through pattern;Decreased arm swing - right;Decreased arm swing - left    Ambulation Surface Level;Indoor                Balance Exercises - 11/07/20 0001      Balance Exercises: Standing   Standing Eyes Opened Narrow base of support (BOS);Head turns;Foam/compliant surface    Standing Eyes Opened Limitations completed horizontal/vertical head turns x 10 reps, increased challenge with horizontal > vertical head turns    Standing Eyes Closed Wide (BOA);Foam/compliant surface;Limitations;Head turns    Standing Eyes Closed Limitations completed with wide BOS 3 x 30 seconds, increased sway with vision removed. With wide BOS, completed horizontal head turns with eyes closed. Intermittent UE support and CGA.    Tandem Stance Eyes open;Eyes closed;Intermittent upper extremity support;3 reps;30 secs;Limitations    Tandem Stance Time completed initially with eyes open, 2 x 30 seconds. progressed to completing with eyes closed. Patient increased difficulty with RLE posterior.    SLS with Vectors Foam/compliant surface;Intermittent upper extremity assist;Limitations    SLS with Vectors Limitations standing on airex, completed alternating toe taps to 4" step with BUE support initially progressing to fingertip support to no support. CGA with completion, verbal cues for control.    Rockerboard Anterior/posterior;Head turns;EO;EC;Intermittent UE support;Limitations    Rockerboard Limitations on rockerboard A/P; completed standing with eyes open and holding board steady 2 x 1 minute. Progressed to eyes closed paitent able to hold <10 secs, intermittent CGA from PT. Completed  standing with vertical head turns 1 x 10 reps, increased sway noted with intermittent UE support for steadying with completion.    Balance Beam standing on red balance with feet shoulder width apart, completed alternating shoulder flexion with eyes open 1 x 15 reps bilaterally. intermittent CGA and pauses to regain balance as needed.    Tandem Gait Forward;Retro;Intermittent upper extremity support;Limitations    Tandem Gait Limitations completed  tandem walking forward x 3 laps with intermittent UE support. Progressed to completing retro with light UE support throughout.               PT Short Term Goals - 10/24/20 0939      PT SHORT TERM GOAL #1   Title Pt will be independent with initial HEP in order to build upon functional gains made in therapy. ALL STGS DUE 11/14/20    Time 3    Period Weeks    Status New    Target Date 11/14/20      PT SHORT TERM GOAL #2   Title Pt will undergo further assessment of SOT - with LTG written as appropriate.    Baseline 3    Period Weeks    Status New      PT SHORT TERM GOAL #3   Title Pt will improve FGA score to at least a 23/30 in order to demo decr fall risk.    Baseline 21/30    Time 3    Period Weeks    Status New      PT SHORT TERM GOAL #4   Title Pt will improve 5x sit <> stand to 13 seconds or less in order to demo improved BLE strength.    Baseline 16.34 seconds    Time 3    Period Weeks    Status New      PT SHORT TERM GOAL #5   Title Pt will perform 8 steps with no handrail and reciprocal pattern with supervision in order to demo improved BLE strength and balance    Baseline 3    Period Weeks    Status New             PT Long Term Goals - 10/24/20 0941      PT LONG TERM GOAL #1   Title Pt will be independent with final HEP in order to build upon functional gains made in therapy    Time 6    Period Weeks    Status New    Target Date 12/05/20      PT LONG TERM GOAL #2   Title SOT goal to be written as appropriate.    Time 6    Period Weeks    Status New      PT LONG TERM GOAL #3   Title Pt will improve FGA score to at least a 25/30 in order to demo decr fall risk.    Baseline 21/30    Time 6    Period Weeks    Status New      PT LONG TERM GOAL #4   Title Pt will improve condition 4 of mCTSIB to at least 10 seconds to demo improved vestibular input for balance.    Baseline 2 seconds.    Time 6    Period Weeks    Status New      PT LONG  TERM GOAL #5   Title Pt will ambulate at least 500' outdoors over unlevel surfaces with supervision in order to demo improved  community mobility.    Time 6    Period Weeks    Status New                 Plan - 11/07/20 1458    Clinical Impression Statement Verbal return to therapy provided by MD. Today's skilled PT session focused on continued balance activites, working on complaint surfaces and horizontal/head turns. Continue to demo decreased balance with vision removed requiring intermittent UE support. Paitent require increased rest breaks throughout session due to fatigue. Will continue to progress toward LTGs.    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 perform SOT.  high level balance over compliant surfaces, functional BLE strength.    Consulted and Agree with Plan of Care Patient   wife [name]          Patient will benefit from skilled therapeutic intervention in order to improve the following deficits and impairments:  Abnormal gait,Decreased balance,Decreased activity tolerance,Decreased coordination,Decreased strength,Impaired  vision/preception,Difficulty walking,Dizziness  Visit Diagnosis: Unsteadiness on feet  Other abnormalities of gait and mobility  Muscle weakness (generalized)  Difficulty in walking, not elsewhere classified     Problem List There are no problems to display for this patient.   Tempie Donning, PT, DPT 11/07/2020, 3:01 PM  [facility] [address] [place], [place], [address] Phone: [phone]   Fax:  [phone]  Name: Hakeem Frazzini MRN: [id number] Date of Birth: [birth year]

## 2020-11-07 NOTE — Therapy (Signed)
[facility] [address] [place], [place], [address] Phone: [phone]   Fax:  [phone]  Occupational Therapy Treatment  Patient Details  Name: [name] MRN: [id number] Date of Birth: 1961/06/01 No data recorded  Encounter Date: 11/07/2020   OT End of Session - 11/07/20 1322    Visit Number 2    Number of Visits 13    Date for OT Re-Evaluation 12/09/20    Authorization Type [employer]    Authorization Time Period VL 30 combined PT/OT    OT Start Time 1320    OT Stop Time 1400    OT Time Calculation (min) 40 min    Activity Tolerance Patient tolerated treatment well    Behavior During Therapy [facility] for tasks assessed/performed           History reviewed. No pertinent past medical history.  History reviewed. No pertinent surgical history.  There were no vitals filed for this visit.   Subjective Assessment - 11/07/20 1323    Subjective  Reports when R eye was open terrible double vision. 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    Patient is accompanied by: Family member   Stacey, spouse   Pertinent History PMH hypercholesteremia, HTN, Acoustic Neuroma, VP shunt, Bell's Palsy    Limitations Fall Risk    Patient Stated Goals using tool like a hammer    Currently in Pain? No/denies                        OT Treatments/Exercises (OP) - 11/07/20 1541      Exercises   Exercises Hand      Fine Motor Coordination (Hand/Wrist)   Fine Motor Coordination Manipulation of small objects;Small Pegboard    Small Pegboard semi circle pegboard this day with RUE with min drops and difficulty with smallest size pegs    Manipulation of small objects see pt intructions. Coordination HEP                  OT Education - 11/07/20 1328    Education Details HEP coordination with RUE -  see pt instructions    Person(s) Educated Spouse;Patient    Methods  Explanation;Demonstration;Handout    Comprehension Verbalized understanding;Returned demonstration            OT Short Term Goals - 10/28/20 1527      OT SHORT TERM GOAL #1   Title Pt will be independent with HEP    Time 3    Period Weeks    Status New    Target Date 12/09/20      OT SHORT TERM GOAL #2   Title Pt will demonstrate increased fine motor coordination as evidenced by increasing 9 hole peg test score by at least 5 seconds with RUE.    Baseline RUE 60.16    Time 3    Period Weeks    Status New      OT SHORT TERM GOAL #3   Title Pt will complete environmental scanning with 80% accuracy with partial occlusion PRN and decrease in reports of diplopia.    Time 3    Period Weeks    Status New      OT SHORT TERM GOAL #4   Title Pt will verbalize understanding of visual strategies for diplopia other visual disturbances    Time 3    Period Weeks    Status New  OT SHORT TERM GOAL #5   Title Pt will increase range of motion in RUE shoulder to at least 120 degrees in order to obtain item from overhead shelf with pain less than or equal to 6/10    Baseline pain 7/10 115 degrees    Time 3    Period Weeks    Status New             OT Long Term Goals - 10/28/20 1534      OT LONG TERM GOAL #1   Title Pt will be independent with updated HEP 12/09/2020    Time 6    Period Weeks    Status New    Target Date 12/09/20      OT LONG TERM GOAL #2   Title Pt will report increase ease with use of hand held tools (i.e. hammer) with RUE, dominant hand and with mod I    Time 6    Period Weeks    Status New      OT LONG TERM GOAL #3   Title Pt will demonstrate increased fine motor coordination with increased 9 hole peg test score of at least 10 seconds    Baseline RUE 60.16    Time 6    Period Weeks    Status New      OT LONG TERM GOAL #4   Title Pt will perform Box and Blocks test of 32 blocks or more with RUE for increase in functional use.    Baseline RUE 27     Time 6    Period Weeks    Status New      OT LONG TERM GOAL #5   Title Pt will perform handwriting of one sentence with legibility of 90% and in appropriate time with right, dominant, hand.    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 Pt motivated to increase function in RUE and return to normal activities.    OT Occupational Profile and History Detailed Assessment- Review of Records and additional review of physical, cognitive, psychosocial history related to current functional performance    Occupational performance deficits (Please refer to evaluation for details): IADL's;ADL's;Social Participation;Rest and Sleep;Leisure;Education    Body Structure / Function / Physical Skills ADL;Balance;Coordination;FMC;Flexibility;Dexterity;Strength;Pain;GMC;UE functional use;ROM;IADL;Vision    Cognitive Skills Attention;Problem Solve;Thought;Learn;Understand;Perception    Rehab Potential Fair    Clinical Decision Making Limited treatment options, no task modification necessary    Comorbidities Affecting Occupational Performance: None    Modification or Assistance to Complete Evaluation  No modification of tasks or assist necessary to complete eval    OT Frequency 2x / week    OT Duration 6 weeks   or 12 visits + eval over extended time d/t scheduling conflicts   OT Treatment/Interventions Balance training;Moist Heat;Fluidtherapy;Self-care/ADL training;Therapeutic activities;Cognitive remediation/compensation;Therapeutic exercise;Electrical Stimulation;Cryotherapy;Energy conservation;Neuromuscular education;Functional Mobility  Training;Passive range of motion;Visual/perceptual remediation/compensation;Patient/family education;Manual Therapy    Plan continue to assess vision and cognitive skills  Recommended Other Services Pt has referral for ST and is currently receiving PT    Consulted and Agree with Plan of Care Patient;Family member/caregiver    Family Member Consulted spouse, Misty Stanley           Patient will benefit from skilled therapeutic intervention in order to improve the following deficits and impairments:   Body Structure / Function / Physical Skills: ADL,Balance,Coordination,FMC,Flexibility,Dexterity,Strength,Pain,GMC,UE functional use,ROM,IADL,Vision Cognitive Skills: Attention,Problem Solve,Thought,Learn,Understand,Perception     Visit Diagnosis: Unsteadiness on feet  Other lack of coordination  Other abnormalities of gait and mobility  Muscle weakness (generalized)  Visuospatial deficit    Problem List There are no problems to display for this patient.   Junious Dresser MOT, OTR/L  11/07/2020, 3:42 PM  [facility] [address] [place], [place], [address] Phone: [phone]   Fax:  [phone]  Name: [name] MRN: [id number] Date of Birth: 02-Aug-1961

## 2020-11-07 NOTE — Patient Instructions (Signed)
  Coordination Activities  Perform the following activities for 20 minutes 2 times per day with right hand(s).   Rotate ball in fingertips (clockwise and counter-clockwise).  Toss ball between hands.  Toss ball in air and catch with the same hand.  Flip cards 1 at a time as fast as you can.  Deal cards with your thumb (Hold deck in hand and push card off top with thumb).  Pick up coins, buttons, marbles, dried beans/pasta of different sizes and place in container.  Pick up coins and place in container or coin bank.  Pick up coins and stack.  Pick up coins one at a time until you get 5-10 in your hand, then move coins from palm to fingertips to stack one at a time.

## 2020-11-11 ENCOUNTER — Encounter: Payer: Self-pay | Admitting: Physical Therapy

## 2020-11-11 ENCOUNTER — Other Ambulatory Visit: Payer: Self-pay

## 2020-11-11 ENCOUNTER — Ambulatory Visit: Payer: 59 | Admitting: Physical Therapy

## 2020-11-11 DIAGNOSIS — M6281 Muscle weakness (generalized): Secondary | ICD-10-CM

## 2020-11-11 DIAGNOSIS — R2681 Unsteadiness on feet: Secondary | ICD-10-CM | POA: Diagnosis not present

## 2020-11-11 DIAGNOSIS — R278 Other lack of coordination: Secondary | ICD-10-CM

## 2020-11-11 DIAGNOSIS — R2689 Other abnormalities of gait and mobility: Secondary | ICD-10-CM

## 2020-11-11 DIAGNOSIS — R262 Difficulty in walking, not elsewhere classified: Secondary | ICD-10-CM

## 2020-11-11 NOTE — Therapy (Signed)
[facility] [address] [place], [place], [address] Phone: [phone]   Fax:  [phone]  Physical Therapy Treatment  Patient Details  Name: [name] MRN: [id number] Date of Birth: Mar 03, 1961 Referring Provider (PT): Perrin Maltese, [place]   Encounter Date: 11/11/2020   PT End of Session - 11/11/20 1452    Visit Number 5    Number of Visits 13    Authorization Type [employer] - 30 VL between PT and OT (will start over at beginning of year)    Authorization - Visit Number 4    Authorization - Number of Visits 30    PT Start Time 2924    PT Stop Time 1445    PT Time Calculation (min) 42 min    Equipment Utilized During Treatment Gait belt    Activity Tolerance Patient tolerated treatment well    Behavior During Therapy [facility] for tasks assessed/performed           History reviewed. No pertinent past medical history.  History reviewed. No pertinent surgical history.  There were no vitals filed for this visit.   Subjective Assessment - 11/11/20 1404    Subjective Reports his balance has been a little bit more off after the surgery. No falls.    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    Currently in Pain? No/denies              [facility] PT Assessment - 11/11/20 1406      Functional Gait  Assessment   Gait assessed  Yes    Gait Level Surface Walks 20 ft in less than 5.5 sec, no assistive devices, good speed, no evidence for imbalance, normal gait pattern, deviates no more than 6 in outside of the 12 in walkway width.    Change in Gait Speed Able to smoothly change walking speed without loss of balance or gait deviation. Deviate no more than 6 in  outside of the 12 in walkway width.    Gait with Horizontal Head Turns Performs head turns smoothly with no change in gait. Deviates no more than 6 in outside 12 in walkway width    Gait with Vertical Head Turns Performs task with slight change in gait velocity (eg, minor disruption to smooth gait path), deviates 6 - 10 in outside 12 in walkway width or uses assistive device    Gait and Pivot Turn Pivot turns safely within 3 sec and stops quickly with no loss of balance.    Step Over Obstacle Is able to step over 2 stacked shoe boxes taped together (9 in total height) without changing gait speed. No evidence of imbalance.    Gait with Narrow Base of Support Ambulates less than 4 steps heel to toe or cannot perform without assistance.    Gait with Eyes Closed Cannot walk 20 ft without assistance, severe gait deviations or imbalance, deviates greater than 15 in outside 12 in walkway width or will not attempt task.   veers to the L   Ambulating Backwards Walks 20 ft, no assistive devices, good speed, no evidence for imbalance, normal gait    Steps Alternating feet, no rail.    Total Score 23  [facility] PT Treatment/Exercise - 11/11/20 0001      Transfers   Five time sit to stand comments  12.75 seconds with no UE support      Ambulation/Gait   Stairs Yes    Stairs Assistance 5: Supervision    Stair Management Technique No rails;Alternating pattern;Forwards    Number of Stairs 8    Height of Stairs 6             Access Code: [email or web address] URL: [email or web address] Date: 11/11/2020 Prepared by: Janann August  Upgraded pt's HEP, See MedBridge for further details.  Exercises Standing Single Leg Stance with Counter Support - 1 x daily - 7 x weekly - 3 sets - 10-15 hold Tandem Walking with Counter Support - 2 x daily - 5 x weekly - 3 sets - forward tandem gait and then retro tandem gait with fingertip support Standing Marching - 2 x  daily - 5 x weekly - 3 sets - cues for slowed and controlled  Tandem Stance with Support - 2 x daily - 5 x weekly - 2 sets - 5 reps - progressed to tandem in corner with 2 x 5 reps head turns and 2 x 5 reps head nods with each leg posteriorly, with RLE performed slightly modified tandem stance due to incr difficulty, intermittent touch to chair for balance  Wide Stance with Eyes Closed and Head Rotation on Foam Pad - 1 x daily - 5 x weekly - 2 sets - 10 reps - with head turns/nods, progressed to feet hip width distance  Romberg Stance Eyes Closed on Foam Pad - 1 x daily - 5 x weekly - 3 sets - 30 hold - static holds and progressed to only slightly a bit of space between feet.         PT Short Term Goals - 11/11/20 1415      PT SHORT TERM GOAL #1   Title Pt will be independent with initial HEP in order to build upon functional gains made in therapy. ALL STGS DUE 11/14/20    Baseline met, upgraded on 11/11/20    Time 3    Period Weeks    Status Achieved    Target Date 11/14/20      PT SHORT TERM GOAL #2   Title Pt will undergo further assessment of SOT - with LTG written as appropriate.    Baseline 3    Period Weeks    Status New      PT SHORT TERM GOAL #3   Title Pt will improve FGA score to at least a 23/30 in order to demo decr fall risk.    Baseline 21/30, 23/30 on 11/11/20    Time 3    Period Weeks    Status Achieved      PT SHORT TERM GOAL #4   Title Pt will improve 5x sit <> stand to 13 seconds or less in order to demo improved BLE strength.    Baseline 16.34 seconds, 12.75 seconds on 11/11/20    Time 3    Period Weeks    Status Achieved      PT SHORT TERM GOAL #5   Title Pt will perform 8 steps with no handrail and reciprocal pattern with supervision in order to demo improved BLE strength and balance    Baseline met on 11/11/20    Period Weeks    Status Achieved              PT Long Term  Goals - 10/24/20 0941      PT LONG TERM GOAL #1   Title Pt will be  independent with final HEP in order to build upon functional gains made in therapy    Time 6    Period Weeks    Status New    Target Date 12/05/20      PT LONG TERM GOAL #2   Title SOT goal to be written as appropriate.    Time 6    Period Weeks    Status New      PT LONG TERM GOAL #3   Title Pt will improve FGA score to at least a 25/30 in order to demo decr fall risk.    Baseline 21/30    Time 6    Period Weeks    Status New      PT LONG TERM GOAL #4   Title Pt will improve condition 4 of mCTSIB to at least 10 seconds to demo improved vestibular input for balance.    Baseline 2 seconds.    Time 6    Period Weeks    Status New      PT LONG TERM GOAL #5   Title Pt will ambulate at least 500' outdoors over unlevel surfaces with supervision in order to demo improved community mobility.    Time 6    Period Weeks    Status New                 Plan - 11/11/20 1459    Clinical Impression Statement Focus of today's skilled session was assessing pt's STGs. Pt has met 4 out of 5 STGs. Pt is independent with current HEP - upgraded today to make more challenging for incr vestibular input and narrow BOS. Pt improved FGA score to a 23/30 (previously 21/30), pt still with most difficulty with eyes closed and narrow BOS and still at a moderate risk for falls. Pt able to perform 8 steps with no handrail with supervision. Pt is progressing well, will continue to progress towards LTGs.    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), past medical history of PE (in 2016, off DOAC) hypercholesteremia, hypertension    Examination-Activity Limitations Locomotion Level;Stairs;Transfers    Examination-Participation Restrictions Occupation;Community Activity;Yard Work   Community education officer Evolving/Moderate complexity    Rehab Potential Good    PT Frequency 2x /  week    PT Duration 6 weeks    PT Treatment/Interventions ADLs/Self Care Home Management;Gait training;Stair training;Functional mobility training;Therapeutic activities;Therapeutic exercise;Balance training;Neuromuscular re-education;Patient/family education;Vestibular;Visual/perceptual remediation/compensation    PT Next Visit Plan perform SOT with LTG written. how were new HEP additions? corner balance for vestibular input, SLS, tandem gait/marching.   high level balance over compliant surfaces, functional BLE strength (esp RLE)    Consulted and Agree with Plan of Care Patient   wife Marzetta Board          Patient will benefit from skilled therapeutic intervention in order to improve the following deficits and impairments:  Abnormal gait,Decreased balance,Decreased activity tolerance,Decreased coordination,Decreased strength,Impaired vision/preception,Difficulty walking,Dizziness  Visit Diagnosis: Unsteadiness on feet  Other lack of coordination  Other abnormalities of gait and mobility  Muscle weakness (generalized)  Difficulty in walking, not elsewhere classified     Problem List There are no problems to display for this patient.   Arliss Journey, PT, DPT  11/11/2020, 3:00 PM  [facility] [address] [place], [place], [address] Phone: [phone]   Fax:  [phone]  Name: [name] MRN: [id number] Date of Birth: 1961/07/18

## 2020-11-13 ENCOUNTER — Other Ambulatory Visit: Payer: Self-pay

## 2020-11-13 ENCOUNTER — Encounter: Payer: Self-pay | Admitting: Occupational Therapy

## 2020-11-13 ENCOUNTER — Ambulatory Visit: Payer: 59 | Admitting: Physical Therapy

## 2020-11-13 ENCOUNTER — Ambulatory Visit: Payer: 59 | Admitting: Occupational Therapy

## 2020-11-13 ENCOUNTER — Encounter: Payer: Self-pay | Admitting: Physical Therapy

## 2020-11-13 DIAGNOSIS — R2689 Other abnormalities of gait and mobility: Secondary | ICD-10-CM

## 2020-11-13 DIAGNOSIS — R41840 Attention and concentration deficit: Secondary | ICD-10-CM

## 2020-11-13 DIAGNOSIS — R278 Other lack of coordination: Secondary | ICD-10-CM

## 2020-11-13 DIAGNOSIS — R41842 Visuospatial deficit: Secondary | ICD-10-CM

## 2020-11-13 DIAGNOSIS — R2681 Unsteadiness on feet: Secondary | ICD-10-CM

## 2020-11-13 DIAGNOSIS — M6281 Muscle weakness (generalized): Secondary | ICD-10-CM

## 2020-11-13 DIAGNOSIS — M25511 Pain in right shoulder: Secondary | ICD-10-CM

## 2020-11-13 DIAGNOSIS — M25611 Stiffness of right shoulder, not elsewhere classified: Secondary | ICD-10-CM

## 2020-11-13 DIAGNOSIS — R41844 Frontal lobe and executive function deficit: Secondary | ICD-10-CM

## 2020-11-13 NOTE — Therapy (Signed)
[facility] [address] [place], [place], [address] Phone: [phone]   Fax:  [phone]  Occupational Therapy Treatment  Patient Details  Name: [name] MRN: [id number] Date of Birth: 09-10-1961 No data recorded  Encounter Date: 11/13/2020   OT End of Session - 11/13/20 1103    Visit Number 3    Number of Visits 13    Date for OT Re-Evaluation 12/09/20    Authorization Type [employer]    Authorization Time Period VL 30 combined PT/OT    OT Start Time 1103    OT Stop Time 1145    OT Time Calculation (min) 42 min    Activity Tolerance Patient tolerated treatment well    Behavior During Therapy [facility] for tasks assessed/performed           History reviewed. No pertinent past medical history.  History reviewed. No pertinent surgical history.  There were no vitals filed for this visit.   Subjective Assessment - 11/13/20 1103    Subjective  Pt denies any pain and says nothing new.    Patient is accompanied by: Family member   Stacey, spouse   Pertinent History PMH hypercholesteremia, HTN, Acoustic Neuroma, VP shunt, Bell's Palsy    Limitations Fall Risk    Patient Stated Goals using tool like a hammer    Currently in Pain? Yes    Pain Score 4     Pain Location Shoulder    Pain Orientation Right    Pain Descriptors / Indicators Sharp;Sore    Pain Type Acute pain    Pain Onset More than a month ago    Pain Frequency Intermittent                        OT Treatments/Exercises (OP) - 11/13/20 1105      Fine Motor Coordination (Hand/Wrist)   Fine Motor Coordination Nuts and Bolts;Grooved pegs    Small Pegboard completed small peg board design with increased time and use of RUE for increase in fine motor coordination. Pt did not have any errors and little to no drops    Nuts and Bolts increased time and slight ataxic like movements with RUE. Pt encouraged to only use LUE for stabilizing  and to unscrew and screw nuts and bolts with RUE    Grooved pegs with LUE and removed with use of tweezers. 2-3 drops during acitvity and required increased time                  OT Education - 11/13/20 1144    Education Details issued supine shoulder exercises - see pt instructions    Person(s) Educated Spouse;Patient    Methods Explanation;Demonstration;Handout    Comprehension Verbalized understanding;Returned demonstration            OT Short Term Goals - 10/28/20 1527      OT SHORT TERM GOAL #1   Title Pt will be independent with HEP    Time 3    Period Weeks    Status New    Target Date 12/09/20      OT SHORT TERM GOAL #2   Title Pt will demonstrate increased fine motor coordination as evidenced by increasing 9 hole peg test score by at least 5 seconds with RUE.    Baseline RUE 60.16    Time 3    Period Weeks    Status New      OT SHORT TERM GOAL #  3   Title Pt will complete environmental scanning with 80% accuracy with partial occlusion PRN and decrease in reports of diplopia.    Time 3    Period Weeks    Status New      OT SHORT TERM GOAL #4   Title Pt will verbalize understanding of visual strategies for diplopia other visual disturbances    Time 3    Period Weeks    Status New      OT SHORT TERM GOAL #5   Title Pt will increase range of motion in RUE shoulder to at least 120 degrees in order to obtain item from overhead shelf with pain less than or equal to 6/10    Baseline pain 7/10 115 degrees    Time 3    Period Weeks    Status New             OT Long Term Goals - 10/28/20 1534      OT LONG TERM GOAL #1   Title Pt will be independent with updated HEP 12/09/2020    Time 6    Period Weeks    Status New    Target Date 12/09/20      OT LONG TERM GOAL #2   Title Pt will report increase ease with use of hand held tools (i.e. hammer) with RUE, dominant hand and with mod I    Time 6    Period Weeks    Status New      OT LONG TERM GOAL  #3   Title Pt will demonstrate increased fine motor coordination with increased 9 hole peg test score of at least 10 seconds    Baseline RUE 60.16    Time 6    Period Weeks    Status New      OT LONG TERM GOAL #4   Title Pt will perform Box and Blocks test of 32 blocks or more with RUE for increase in functional use.    Baseline RUE 27    Time 6    Period Weeks    Status New      OT LONG TERM GOAL #5   Title Pt will perform handwriting of one sentence with legibility of 90% and in appropriate time with right, dominant, hand.    Baseline 50% legibility with increased time    Time 6    Period Weeks    Status New      OT LONG TERM GOAL #6   Title Pt will perform environmental scanning with 100% accuracy with partial occlusion PRN and no reports or complaints of diplopia    Time 6    Period Weeks    Status New                 Plan - 11/13/20 1324    Clinical Impression Statement Pt is progressing towards goals.    OT Occupational Profile and History Detailed Assessment- Review of Records and additional review of physical, cognitive, psychosocial history related to current functional performance    Occupational performance deficits (Please refer to evaluation for details): IADL's;ADL's;Social Participation;Rest and Sleep;Leisure;Education    Body Structure / Function / Physical Skills ADL;Balance;Coordination;FMC;Flexibility;Dexterity;Strength;Pain;GMC;UE functional use;ROM;IADL;Vision    Cognitive Skills Attention;Problem Solve;Thought;Learn;Understand;Perception    Rehab Potential Fair    Clinical Decision Making Limited treatment options, no task modification necessary    Comorbidities Affecting Occupational Performance: None    Modification or Assistance to Complete Evaluation  No modification of tasks or assist necessary  to complete eval    OT Frequency 2x / week    OT Duration 6 weeks   or 12 visits + eval over extended time d/t scheduling conflicts   OT  Treatment/Interventions Balance training;Moist Heat;Fluidtherapy;Self-care/ADL training;Therapeutic activities;Cognitive remediation/compensation;Therapeutic exercise;Electrical Stimulation;Cryotherapy;Energy conservation;Neuromuscular education;Functional Mobility Training;Passive range of motion;Visual/perceptual remediation/compensation;Patient/family education;Manual Therapy    Plan continue to assess vision and cognitive skills    Recommended Other Services Pt has referral for ST and is currently receiving PT    Consulted and Agree with Plan of Care Patient;Family member/caregiver    Family Member Consulted spouse, Misty Stanley           Patient will benefit from skilled therapeutic intervention in order to improve the following deficits and impairments:   Body Structure / Function / Physical Skills: ADL,Balance,Coordination,FMC,Flexibility,Dexterity,Strength,Pain,GMC,UE functional use,ROM,IADL,Vision Cognitive Skills: Attention,Problem Solve,Thought,Learn,Understand,Perception     Visit Diagnosis: Unsteadiness on feet  Other lack of coordination  Other abnormalities of gait and mobility  Muscle weakness (generalized)  Visuospatial deficit  Frontal lobe and executive function deficit  Attention and concentration deficit  Stiffness of right shoulder, not elsewhere classified  Acute pain of right shoulder    Problem List There are no problems to display for this patient.   Osborne Casco Mehgan Santmyer MOT, OTR/L  11/13/2020, 1:33 PM  [facility] [address] [place], [place], [address] Phone: [phone]   Fax:  [phone]  Name: Erin Uecker MRN: [id number] Date of Birth: [birth year]

## 2020-11-13 NOTE — Therapy (Signed)
[facility] [address] [place], [place], [address] Phone: [phone]   Fax:  [phone]  Physical Therapy Treatment  Patient Details  Name: [name] MRN: [id number] Date of Birth: 1960-12-26 Referring Provider (PT): Nelwyn Salisbury, [place]   Encounter Date: 11/13/2020   PT End of Session - 11/13/20 1542    Visit Number 6    Number of Visits 13    Authorization Type [employer] - 30 VL between PT and OT (will start over at beginning of year)    Authorization - Visit Number 5    Authorization - Number of Visits 30    PT Start Time 1230    PT Stop Time 1311    PT Time Calculation (min) 41 min    Equipment Utilized During Treatment Gait belt    Activity Tolerance Patient tolerated treatment well    Behavior During Therapy [facility] for tasks assessed/performed           History reviewed. No pertinent past medical history.  History reviewed. No pertinent surgical history.  There were no vitals filed for this visit.   Subjective Assessment - 11/13/20 1232    Subjective Saw the eye doctor yesterday, states that everything looks good.    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    Currently in Pain? No/denies                               Neuro re-ed: sensory organization test performed with following results: Conditions: 1: all 3 trials above age related norms  2:  all 3 trials above age related norms  3:  all 3 trials above age related norms   4: first trial below age related norm, 2nd trial above 5: first trial fall, 2nd above normal, 3rd below normal 6:  all 3 trials above age related norms  Composite score: 70 Sensory  Analysis Som: ~95 Vis: ~80 Vest: ~30 Pref: 100   Strategy analysis: mix of ankle/hip strategy, primarily hip dominant during conditions 5 and 6       COG alignment: WFL, COG towards left and anteriorly, primarily during condition 6  Educated pt and pt's spouse findings of SOT and decr vestibular input for balance            Balance Exercises - 11/13/20 0001      Balance Exercises: Standing   Standing Eyes Closed Narrow base of support (BOS);Foam/compliant surface;3 reps;30 secs    Standing Eyes Closed Limitations on blue air ex, a little bit of space between feet   SLS with Vectors Foam/compliant surface;Intermittent upper extremity assist    SLS with Vectors Limitations alternating toe taps to 2 cones while standing on air ex x12 reps B, intermittent UE support, incr difficulty with stance on RLE but incr with incr reps, min guard/min A for balance    Other Standing Exercises on blue air ex: x10 reps heel toe raises with eyes closed (intermittently resetting and opening eyes every couple reps)              PT Short Term Goals - 11/13/20 1539      PT SHORT TERM GOAL #1  Title Pt will be independent with initial HEP in order to build upon functional gains made in therapy. ALL STGS DUE 11/14/20    Baseline met, upgraded on 11/11/20    Time 3    Period Weeks    Status Achieved    Target Date 11/14/20      PT SHORT TERM GOAL #2   Title Pt will undergo further assessment of SOT - with LTG written as appropriate.    Baseline achieved on 12/22/1 - composite score 70 and vestibular score ~30    Time 3    Period Weeks    Status Achieved      PT SHORT TERM GOAL #3   Title Pt will improve FGA score to at least a 23/30 in order to demo decr fall risk.    Baseline 21/30, 23/30 on 11/11/20    Time 3    Period Weeks    Status Achieved      PT SHORT TERM GOAL #4   Title Pt will improve 5x sit <> stand to 13 seconds or less in order to demo improved BLE strength.    Baseline  16.34 seconds, 12.75 seconds on 11/11/20    Time 3    Period Weeks    Status Achieved      PT SHORT TERM GOAL #5   Title Pt will perform 8 steps with no handrail and reciprocal pattern with supervision in order to demo improved BLE strength and balance    Baseline met on 11/11/20    Period Weeks    Status Achieved             PT Long Term Goals - 11/13/20 1539      PT LONG TERM GOAL #1   Title Pt will be independent with final HEP in order to build upon functional gains made in therapy    Time 6    Period Weeks    Status New      PT LONG TERM GOAL #2   Title Pt will improve SOT score for vestibular for sensory analysis to at least 50 in order to demo improved vestibular input for balance.    Baseline 30 on 11/13/20    Time 6    Period Weeks    Status Revised      PT LONG TERM GOAL #3   Title Pt will improve FGA score to at least a 25/30 in order to demo decr fall risk.    Baseline 21/30    Time 6    Period Weeks    Status New      PT LONG TERM GOAL #4   Title Pt will improve condition 4 of mCTSIB to at least 10 seconds to demo improved vestibular input for balance.    Baseline 2 seconds.    Time 6    Period Weeks    Status New      PT LONG TERM GOAL #5   Title Pt will ambulate at least 500' outdoors over unlevel surfaces with supervision in order to demo improved community mobility.    Time 6    Period Weeks    Status New                 Plan - 11/13/20 1540    Clinical Impression Statement Performed the SOT today with pt scoring a 70 on the composite score (above age related norms) and a 30 for the vestibular portion of the sensory analysis (below age related norms).  Pt above age realted norms for somatosensory and use of vision for balance. LTG revised as appropriate. Remainder of session focused on SLS and eyes closed balance on compliant surfaces. Will continue to progress towards LTGs.    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), past medical history of PE (in 2016, off DOAC) hypercholesteremia, hypertension    Examination-Activity Limitations Locomotion Level;Stairs;Transfers    Examination-Participation Restrictions Occupation;Community Activity;Yard Work   Community education officer Evolving/Moderate complexity    Rehab Potential Good    PT Frequency 2x / week    PT Duration 6 weeks    PT Treatment/Interventions ADLs/Self Care Home Management;Gait training;Stair training;Functional mobility training;Therapeutic activities;Therapeutic exercise;Balance training;Neuromuscular re-education;Patient/family education;Vestibular;Visual/perceptual remediation/compensation    PT Next Visit Plan how were new HEP additions? corner balance for vestibular input, SLS, tandem gait/marching.   high level balance over compliant surfaces, functional BLE strength (esp RLE)    Consulted and Agree with Plan of Care Patient   wife [name]          Patient will benefit from skilled therapeutic intervention in order to improve the following deficits and impairments:  Abnormal gait,Decreased balance,Decreased activity tolerance,Decreased coordination,Decreased strength,Impaired vision/preception,Difficulty walking,Dizziness  Visit Diagnosis: Unsteadiness on feet  Other lack of coordination  Other abnormalities of gait and mobility  Muscle weakness (generalized)     Problem List There are no problems to display for this patient.   Arliss Journey , PT, DPT  11/13/2020, 3:42 PM  [facility] [address] [place], [place], [address] Phone: [phone]   Fax:  [phone]  Name: [name] MRN: [id number] Date of Birth: [birth year]

## 2020-11-13 NOTE — Patient Instructions (Signed)
Cranial Flexion: Overhead Arm Extension - Supine (Medicine Ball) ? ? ? ?Lie with knees bent, arms beyond head, holding paper towel roll. Pull roll up to above face. ?Repeat _10_ times per set. Do _3 sets per session. Do _5-7_ sessions per week. ? ?ROM: Horizontal Abduction / Adduction - Wand ? ? ? ?Keeping hands on either side of the paper towel roll. Then pull back across body, in both directions. Do not allow trunk to twist. Hold _5_ seconds. ?Repeat _10_ times per set. Do _3_ sets per session. Do _5-7_ sessions per week. ? ?Supine: Chest Press (Active) ? ? ? ?Lie on back with arms fully extended and hands on either side of the paper towel roll. Lower roll slowly to chest and press to arm's length.  ?Complete _3__ sets of _10__ repetitions. Perform  5-7___ sessions per week ? ? ? ?Copyright ? [employer]. All rights reserved.   ?

## 2020-11-19 ENCOUNTER — Ambulatory Visit: Payer: 59

## 2020-11-19 ENCOUNTER — Other Ambulatory Visit: Payer: Self-pay

## 2020-11-19 DIAGNOSIS — R2689 Other abnormalities of gait and mobility: Secondary | ICD-10-CM

## 2020-11-19 DIAGNOSIS — R2681 Unsteadiness on feet: Secondary | ICD-10-CM | POA: Diagnosis not present

## 2020-11-19 DIAGNOSIS — M6281 Muscle weakness (generalized): Secondary | ICD-10-CM

## 2020-11-19 DIAGNOSIS — R262 Difficulty in walking, not elsewhere classified: Secondary | ICD-10-CM

## 2020-11-19 NOTE — Therapy (Signed)
[facility] [address] [place], [place], [address] Phone: [phone]   Fax:  [phone]  Physical Therapy Treatment  Patient Details  Name: [name] MRN: [id number] Date of Birth: Jul 29, 1961 Referring Provider (PT): Perrin Maltese, [place]   Encounter Date: 11/19/2020   PT End of Session - 11/19/20 1451    Visit Number 7    Number of Visits 13    Authorization Type [employer] - 30 VL between PT and OT (will start over at beginning of year)    Authorization - Visit Number 6    Authorization - Number of Visits 30    PT Start Time 3149    PT Stop Time 1529    PT Time Calculation (min) 43 min    Equipment Utilized During Treatment Gait belt    Activity Tolerance Patient tolerated treatment well    Behavior During Therapy [facility] for tasks assessed/performed           History reviewed. No pertinent past medical history.  History reviewed. No pertinent surgical history.  There were no vitals filed for this visit.   Subjective Assessment - 11/19/20 1449    Subjective Patient reports no new changes/complaints. No falls.    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    Currently in Pain? No/denies                [facility] Adult PT Treatment/Exercise - 11/19/20 0001      Ambulation/Gait   Ambulation/Gait Yes    Ambulation/Gait Assistance 5: Supervision    Ambulation/Gait Assistance Details completed ambulation on unlevel outdoors surfaces without AD over paved/grass. no instances of instability, supervision overall.    Ambulation Distance (Feet) 350 Feet    Assistive device None    Gait Pattern Step-through pattern;Decreased arm swing -  right;Decreased arm swing - left    Ambulation Surface Level;Indoor      High Level Balance   High Level Balance Activities Head turns;Sudden stops;Backward walking    High Level Balance Comments On outdoor surfaces (grass) completed forward walking with sudden stops followed by backwards walking 3 x 35', then completed horizontal/vertical head turns 1 x 35' reps each direction.intermittent CGA, patient often demonstrate veering with horizontal heda turns on grass surface.               Balance Exercises - 11/19/20 0001      Balance Exercises: Standing   Standing Eyes Closed Narrow base of support (BOS);Foam/compliant surface;3 reps;30 secs    Standing Eyes Closed Limitations on blue airex, completed 3 x 25-30 seconds. CGA throughout completion.    Tandem Stance Eyes open;Foam/compliant surface;Intermittent upper extremity support;2 reps;30 secs;Limitations    Tandem Stance Time completed alternating foot position with eyes open 2 x 30 seconds.    SLS with Vectors Solid surface;Intermittent upper extremity assist;Limitations    SLS with Vectors Limitations completed standing on firm surface with SLS activity, alternating placing lower extremity on soccer ball and completing frwd/backward and lateral movements x 10 each direction on BLE with soccerball. Patient have increased difficulty with SLS on RLE. intermittent UE support and CGA from PT    Balance Beam standing on blue balance with feet shoulder width, completd horizontal/vertical  head turns x 10 reps each direction, increased challenge with vertical > horizontal.    Tandem Gait Forward;Foam/compliant surface;3 reps;Limitations    Tandem Gait Limitations completed tandem gait forward x 3 laps down and back on blue mat. intermittent UE support required.    Marching Foam/compliant surface;Limitations    Marching Limitations standing on blue mat completed standing alternating marching with EO 2 X 30 seconds. verbal cues to slow pace for  improved control.               PT Short Term Goals - 11/13/20 1539      PT SHORT TERM GOAL #1   Title Pt will be independent with initial HEP in order to build upon functional gains made in therapy. ALL STGS DUE 11/14/20    Baseline met, upgraded on 11/11/20    Time 3    Period Weeks    Status Achieved    Target Date 11/14/20      PT SHORT TERM GOAL #2   Title Pt will undergo further assessment of SOT - with LTG written as appropriate.    Baseline achieved on 12/22/1 - composite score 70 and vestibular score ~30    Time 3    Period Weeks    Status Achieved      PT SHORT TERM GOAL #3   Title Pt will improve FGA score to at least a 23/30 in order to demo decr fall risk.    Baseline 21/30, 23/30 on 11/11/20    Time 3    Period Weeks    Status Achieved      PT SHORT TERM GOAL #4   Title Pt will improve 5x sit <> stand to 13 seconds or less in order to demo improved BLE strength.    Baseline 16.34 seconds, 12.75 seconds on 11/11/20    Time 3    Period Weeks    Status Achieved      PT SHORT TERM GOAL #5   Title Pt will perform 8 steps with no handrail and reciprocal pattern with supervision in order to demo improved BLE strength and balance    Baseline met on 11/11/20    Period Weeks    Status Achieved             PT Long Term Goals - 11/13/20 1539      PT LONG TERM GOAL #1   Title Pt will be independent with final HEP in order to build upon functional gains made in therapy    Time 6    Period Weeks    Status New      PT LONG TERM GOAL #2   Title Pt will improve SOT score for vestibular for sensory analysis to at least 50 in order to demo improved vestibular input for balance.    Baseline 30 on 11/13/20    Time 6    Period Weeks    Status Revised      PT LONG TERM GOAL #3   Title Pt will improve FGA score to at least a 25/30 in order to demo decr fall risk.    Baseline 21/30    Time 6    Period Weeks    Status New      PT LONG TERM GOAL #4   Title  Pt will improve condition 4 of mCTSIB to at least 10 seconds to demo improved vestibular input for balance.    Baseline 2 seconds.    Time 6    Period  Weeks    Status New      PT LONG TERM GOAL #5   Title Pt will ambulate at least 500' outdoors over unlevel surfaces with supervision in order to demo improved community mobility.    Time 6    Period Weeks    Status New                 Plan - 11/19/20 1531    Clinical Impression Statement Today's skilled PT session included continued high level balance activities focused on complaint surface and promoting SLS. Also completed ambulation outdoors including high level balance activities on grass surface. Patient continue to demo increased balance challenge with vision removed. Will continue to progress toward all LTGs.    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), past medical history of PE (in 2016, off DOAC) hypercholesteremia, hypertension    Examination-Activity Limitations Locomotion Level;Stairs;Transfers    Examination-Participation Restrictions Occupation;Community Activity;Yard Work   Community education officer Evolving/Moderate complexity    Rehab Potential Good    PT Frequency 2x / week    PT Duration 6 weeks    PT Treatment/Interventions ADLs/Self Care Home Management;Gait training;Stair training;Functional mobility training;Therapeutic activities;Therapeutic exercise;Balance training;Neuromuscular re-education;Patient/family education;Vestibular;Visual/perceptual remediation/compensation    PT Next Visit Plan corner balance for vestibular input, SLS, tandem gait/marching.   high level balance over compliant surfaces, functional BLE strength (esp RLE)    Consulted and Agree with Plan of Care Patient   wife [name]          Patient will benefit from skilled therapeutic intervention in order to  improve the following deficits and impairments:  Abnormal gait,Decreased balance,Decreased activity tolerance,Decreased coordination,Decreased strength,Impaired vision/preception,Difficulty walking,Dizziness  Visit Diagnosis: Unsteadiness on feet  Other abnormalities of gait and mobility  Muscle weakness (generalized)  Difficulty in walking, not elsewhere classified     Problem List There are no problems to display for this patient.   Jones Bales, PT, DPT 11/19/2020, 3:32 PM  [facility] [address] [place], [place], [address] Phone: [phone]   Fax:  [phone]  Name: [name] MRN: [id number] Date of Birth: 1961-01-19

## 2020-11-20 ENCOUNTER — Encounter: Payer: Self-pay | Admitting: Occupational Therapy

## 2020-11-20 ENCOUNTER — Ambulatory Visit: Payer: 59 | Admitting: Occupational Therapy

## 2020-11-20 DIAGNOSIS — R41842 Visuospatial deficit: Secondary | ICD-10-CM

## 2020-11-20 DIAGNOSIS — M6281 Muscle weakness (generalized): Secondary | ICD-10-CM

## 2020-11-20 DIAGNOSIS — R2681 Unsteadiness on feet: Secondary | ICD-10-CM

## 2020-11-20 DIAGNOSIS — R41844 Frontal lobe and executive function deficit: Secondary | ICD-10-CM

## 2020-11-20 DIAGNOSIS — M25611 Stiffness of right shoulder, not elsewhere classified: Secondary | ICD-10-CM

## 2020-11-20 DIAGNOSIS — R41840 Attention and concentration deficit: Secondary | ICD-10-CM

## 2020-11-20 DIAGNOSIS — M25511 Pain in right shoulder: Secondary | ICD-10-CM

## 2020-11-20 DIAGNOSIS — R29818 Other symptoms and signs involving the nervous system: Secondary | ICD-10-CM

## 2020-11-20 DIAGNOSIS — R278 Other lack of coordination: Secondary | ICD-10-CM

## 2020-11-20 NOTE — Therapy (Signed)
[facility] [address] [place], [place], [address] Phone: [phone]   Fax:  [phone]  Occupational Therapy Treatment  Patient Details  Name: [name] MRN: [id number] Date of Birth: Mar 17, 1961 No data recorded  Encounter Date: 11/20/2020   OT End of Session - 11/20/20 1018    Visit Number 4    Number of Visits 13    Date for OT Re-Evaluation 12/09/20    Authorization Type [employer]    Authorization Time Period VL 30 combined PT/OT    OT Start Time 1017    OT Stop Time 1100    OT Time Calculation (min) 43 min    Activity Tolerance Patient tolerated treatment well    Behavior During Therapy [facility] for tasks assessed/performed           History reviewed. No pertinent past medical history.  History reviewed. No pertinent surgical history.  There were no vitals filed for this visit.   Subjective Assessment - 11/20/20 1018    Subjective  Pt denies any pain. Pt says he's feeling good    Patient is accompanied by: Family member   Stacey, spouse   Pertinent History PMH hypercholesteremia, HTN, Acoustic Neuroma, VP shunt, Bell's Palsy    Limitations Fall Risk    Patient Stated Goals using tool like a hammer    Currently in Pain? No/denies    Pain Onset More than a month ago                        OT Treatments/Exercises (OP) - 11/20/20 1020      Exercises   Exercises Work Hardening      Work Hardening Exercises   UBE (Upper Arm Bike) 5 minutes level 2 - 3 forward motion only. no pain reported      Visual/Perceptual Exercises   Copy this Image PVC    PVC figure 2 completed with no difficulty and with appropriate time. more complex pattern in figure 13 and completed with 90% accuracy and appropriate time. Pt used "C" pieces instead of "D" pieces this day.    Scanning Tabletop;Environmental    Scanning - Environmental 80% accuracy (12/15) with minimal distracting environment. All  missed were on right side    Scanning - Tabletop 88 cancellation with 91% accuracy - all errors in bottom half possibly d/t fatigue    Other Exercises some difficulty with depth perception noted with placing resistance clothespins on antenna      Neurological Re-education Exercises   Other Exercises 1 shoulder flexion on therapy ball on wall in standing x 10 reps with verbal cues for positoining      Functional Reaching Activities   High Level RUE 1-8# resistance clothespins.                    OT Short Term Goals - 11/20/20 1032      OT SHORT TERM GOAL #1   Title Pt will be independent with HEP    Time 3    Period Weeks    Status On-going    Target Date 12/09/20      OT SHORT TERM GOAL #2   Title Pt will demonstrate increased fine motor coordination as evidenced by increasing 9 hole peg test score by at least 5 seconds with RUE.    Baseline RUE 60.16    Time 3    Period Weeks    Status On-going  OT SHORT TERM GOAL #3   Title Pt will complete environmental scanning with 80% accuracy with partial occlusion PRN and decrease in reports of diplopia.    Time 3    Period Weeks    Status Achieved   80%     OT SHORT TERM GOAL #4   Title Pt will verbalize understanding of visual strategies for diplopia other visual disturbances    Time 3    Period Weeks    Status New      OT SHORT TERM GOAL #5   Title Pt will increase range of motion in RUE shoulder to at least 120 degrees in order to obtain item from overhead shelf with pain less than or equal to 6/10    Baseline pain 7/10 115 degrees    Time 3    Period Weeks    Status New             OT Long Term Goals - 10/28/20 1534      OT LONG TERM GOAL #1   Title Pt will be independent with updated HEP 12/09/2020    Time 6    Period Weeks    Status New    Target Date 12/09/20      OT LONG TERM GOAL #2   Title Pt will report increase ease with use of hand held tools (i.e. hammer) with RUE, dominant hand and  with mod I    Time 6    Period Weeks    Status New      OT LONG TERM GOAL #3   Title Pt will demonstrate increased fine motor coordination with increased 9 hole peg test score of at least 10 seconds    Baseline RUE 60.16    Time 6    Period Weeks    Status New      OT LONG TERM GOAL #4   Title Pt will perform Box and Blocks test of 32 blocks or more with RUE for increase in functional use.    Baseline RUE 27    Time 6    Period Weeks    Status New      OT LONG TERM GOAL #5   Title Pt will perform handwriting of one sentence with legibility of 90% and in appropriate time with right, dominant, hand.    Baseline 50% legibility with increased time    Time 6    Period Weeks    Status New      OT LONG TERM GOAL #6   Title Pt will perform environmental scanning with 100% accuracy with partial occlusion PRN and no reports or complaints of diplopia    Time 6    Period Weeks    Status New                 Plan - 11/20/20 1027    Clinical Impression Statement Pt is progressing towards goals. No apparent cognitive deficits during functional tasks this day.    OT Occupational Profile and History Detailed Assessment- Review of Records and additional review of physical, cognitive, psychosocial history related to current functional performance    Occupational performance deficits (Please refer to evaluation for details): IADL's;ADL's;Social Participation;Rest and Sleep;Leisure;Education    Body Structure / Function / Physical Skills ADL;Balance;Coordination;FMC;Flexibility;Dexterity;Strength;Pain;GMC;UE functional use;ROM;IADL;Vision    Cognitive Skills Attention;Problem Solve;Thought;Learn;Understand;Perception    Rehab Potential Fair    Clinical Decision Making Limited treatment options, no task modification necessary    Comorbidities Affecting Occupational Performance: None  Modification or Assistance to Complete Evaluation  No modification of tasks or assist necessary to  complete eval    OT Frequency 2x / week    OT Duration 6 weeks   or 12 visits + eval over extended time d/t scheduling conflicts   OT Treatment/Interventions Balance training;Moist Heat;Fluidtherapy;Self-care/ADL training;Therapeutic activities;Cognitive remediation/compensation;Therapeutic exercise;Electrical Stimulation;Cryotherapy;Energy conservation;Neuromuscular education;Functional Mobility Training;Passive range of motion;Visual/perceptual remediation/compensation;Patient/family education;Manual Therapy    Recommended Other Services Pt has referral for ST and is currently receiving PT    Consulted and Agree with Plan of Care Patient;Family member/caregiver    Family Member Consulted spouse, Misty Stanley           Patient will benefit from skilled therapeutic intervention in order to improve the following deficits and impairments:   Body Structure / Function / Physical Skills: ADL,Balance,Coordination,FMC,Flexibility,Dexterity,Strength,Pain,GMC,UE functional use,ROM,IADL,Vision Cognitive Skills: Attention,Problem Solve,Thought,Learn,Understand,Perception     Visit Diagnosis: Visuospatial deficit  Frontal lobe and executive function deficit  Unsteadiness on feet  Attention and concentration deficit  Other lack of coordination  Acute pain of right shoulder  Stiffness of right shoulder, not elsewhere classified  Other symptoms and signs involving the nervous system  Muscle weakness (generalized)    Problem List There are no problems to display for this patient.   Junious Dresser MOT, OTR/L  11/20/2020, 10:58 AM  [facility] [address] [place], [place], [address] Phone: [phone]   Fax:  [phone]  Name: [name] MRN: [id number] Date of Birth: 1961/06/15

## 2020-11-21 ENCOUNTER — Ambulatory Visit: Payer: 59

## 2020-11-21 ENCOUNTER — Other Ambulatory Visit: Payer: Self-pay

## 2020-11-21 DIAGNOSIS — R2689 Other abnormalities of gait and mobility: Secondary | ICD-10-CM

## 2020-11-21 DIAGNOSIS — R2681 Unsteadiness on feet: Secondary | ICD-10-CM

## 2020-11-21 DIAGNOSIS — R262 Difficulty in walking, not elsewhere classified: Secondary | ICD-10-CM

## 2020-11-21 DIAGNOSIS — M6281 Muscle weakness (generalized): Secondary | ICD-10-CM

## 2020-11-21 NOTE — Therapy (Signed)
[facility] [address] [place], [place], [address] Phone: [phone]   Fax:  [phone]  Physical Therapy Treatment  Patient Details  Name: [name] MRN: [id number] Date of Birth: 1961/01/26 Referring Provider (PT): Perrin Maltese, [place]   Encounter Date: 11/21/2020   PT End of Session - 11/21/20 1450    Visit Number 8    Number of Visits 13    Authorization Type [employer] - 30 VL between PT and OT (will start over at beginning of year)    Authorization - Visit Number 7    Authorization - Number of Visits 30    PT Start Time 5537    PT Stop Time 1530    PT Time Calculation (min) 42 min    Equipment Utilized During Treatment Gait belt    Activity Tolerance Patient tolerated treatment well    Behavior During Therapy [facility] for tasks assessed/performed           History reviewed. No pertinent past medical history.  History reviewed. No pertinent surgical history.  There were no vitals filed for this visit.   Subjective Assessment - 11/21/20 1452    Subjective Patient reports no new changes/complaints. No falls.    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    Currently in Pain? No/denies    Pain Onset More than a month ago               [facility] Adult PT Treatment/Exercise - 11/21/20 0001      Transfers   Transfers Sit to Stand;Stand to Sit    Sit to Stand 5: Supervision    Stand to Sit 5: Supervision    Number of Reps 10 reps;2 sets    Comments completed sit <> stand with BLE placed on airex pad x 10 reps without UE support. Progressed to completing with 2# weighted ball x 10 reps      Ambulation/Gait   Ambulation/Gait Yes     Ambulation/Gait Assistance 5: Supervision    Ambulation/Gait Assistance Details ambulation with high level balance    Assistive device None    Gait Pattern Step-through pattern;Decreased arm swing - right;Decreased arm swing - left    Ambulation Surface Level;Indoor      High Level Balance   High Level Balance Activities Negotiating over obstacles;Other (comment)    High Level Balance Comments Completed toe tap to cone with following step over at countertop with intermittnet UE support, completed x 4 laps and down back. initially completed with UE support progressing to no UE, increased CGA required at times. Increased challenge with toe tap and coordination with RLE noted. Completed ambulation with large ball completed self ball toss and visual tracking x 150 ft. Increased difficulty noted, require assistance from PT due to dropping ball.      Neuro Re-ed    Neuro Re-ed Details  Standing on airex with wide BOS and narrow BOS, completed CW circles with ball and addition of visual tracking x 10 reps both. Increased challenge with narrow BOS > wide BOS               Balance Exercises - 11/21/20 0001      Balance Exercises: Standing  Standing Eyes Opened Narrow base of support (BOS);Head turns;Foam/compliant surface;2 reps;Limitations    Standing Eyes Opened Limitations completed narrow BOS with horizontal/vertical head turns with eyes open x 10 reps each.    Standing Eyes Closed Narrow base of support (BOS);Foam/compliant surface;3 reps;30 secs;Wide (BOA);Head turns    Standing Eyes Closed Limitations completed 3 x 30 secs on blue airex. with eyes closed and wide BOS completed horizontal/vertical heda turns x 10 reps each.    Tandem Stance Eyes open;Eyes closed;3 reps;30 secs    Tandem Stance Time completed on firm surface 2 x 30 seconds with eyes open, then progressed to eyes closed.             PT Education - 11/21/20 1544    Education Details educated on completing tandem stance  on firm surface, as patient has been completing on pillow reporting difficulty completing    Person(s) Educated Patient    Methods Explanation;Demonstration    Comprehension Verbalized understanding;Returned demonstration            PT Short Term Goals - 11/13/20 1539      PT SHORT TERM GOAL #1   Title Pt will be independent with initial HEP in order to build upon functional gains made in therapy. ALL STGS DUE 11/14/20    Baseline met, upgraded on 11/11/20    Time 3    Period Weeks    Status Achieved    Target Date 11/14/20      PT SHORT TERM GOAL #2   Title Pt will undergo further assessment of SOT - with LTG written as appropriate.    Baseline achieved on 12/22/1 - composite score 70 and vestibular score ~30    Time 3    Period Weeks    Status Achieved      PT SHORT TERM GOAL #3   Title Pt will improve FGA score to at least a 23/30 in order to demo decr fall risk.    Baseline 21/30, 23/30 on 11/11/20    Time 3    Period Weeks    Status Achieved      PT SHORT TERM GOAL #4   Title Pt will improve 5x sit <> stand to 13 seconds or less in order to demo improved BLE strength.    Baseline 16.34 seconds, 12.75 seconds on 11/11/20    Time 3    Period Weeks    Status Achieved      PT SHORT TERM GOAL #5   Title Pt will perform 8 steps with no handrail and reciprocal pattern with supervision in order to demo improved BLE strength and balance    Baseline met on 11/11/20    Period Weeks    Status Achieved             PT Long Term Goals - 11/13/20 1539      PT LONG TERM GOAL #1   Title Pt will be independent with final HEP in order to build upon functional gains made in therapy    Time 6    Period Weeks    Status New      PT LONG TERM GOAL #2   Title Pt will improve SOT score for vestibular for sensory analysis to at least 50 in order to demo improved vestibular input for balance.    Baseline 30 on 11/13/20    Time 6    Period Weeks    Status Revised      PT LONG  TERM GOAL #3  Title Pt will improve FGA score to at least a 25/30 in order to demo decr fall risk.    Baseline 21/30    Time 6    Period Weeks    Status New      PT LONG TERM GOAL #4   Title Pt will improve condition 4 of mCTSIB to at least 10 seconds to demo improved vestibular input for balance.    Baseline 2 seconds.    Time 6    Period Weeks    Status New      PT LONG TERM GOAL #5   Title Pt will ambulate at least 500' outdoors over unlevel surfaces with supervision in order to demo improved community mobility.    Time 6    Period Weeks    Status New                 Plan - 11/21/20 1547    Clinical Impression Statement Continued balance activities on complaint surface and vision removed. Initiated visual tracking activities with ambulation, increased difficulty with ball toss due to coordination deficits. Educated patient to complete tandem stance on firm surface at home, as has been completing on pillow and is too difficult at this time. Will continue per POC and progress toward all LTGs.    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), past medical history of PE (in 2016, off DOAC) hypercholesteremia, hypertension    Examination-Activity Limitations Locomotion Level;Stairs;Transfers    Examination-Participation Restrictions Occupation;Community Activity;Yard Work   Community education officer Evolving/Moderate complexity    Rehab Potential Good    PT Frequency 2x / week    PT Duration 6 weeks    PT Treatment/Interventions ADLs/Self Care Home Management;Gait training;Stair training;Functional mobility training;Therapeutic activities;Therapeutic exercise;Balance training;Neuromuscular re-education;Patient/family education;Vestibular;Visual/perceptual remediation/compensation    PT Next Visit Plan corner balance for vestibular input, SLS, tandem  gait/marching.   high level balance over compliant surfaces, functional BLE strength (esp RLE)    Consulted and Agree with Plan of Care Patient   wife [name]          Patient will benefit from skilled therapeutic intervention in order to improve the following deficits and impairments:  Abnormal gait,Decreased balance,Decreased activity tolerance,Decreased coordination,Decreased strength,Impaired vision/preception,Difficulty walking,Dizziness  Visit Diagnosis: Unsteadiness on feet  Other abnormalities of gait and mobility  Muscle weakness (generalized)  Difficulty in walking, not elsewhere classified     Problem List There are no problems to display for this patient.   Jones Bales, PT, DPT 11/21/2020, 3:48 PM  [facility] [address] [place], [place], [address] Phone: [phone]   Fax:  [phone]  Name: [name] MRN: [id number] Date of Birth: 09/24/1961

## 2020-11-25 ENCOUNTER — Other Ambulatory Visit: Payer: Self-pay

## 2020-11-25 ENCOUNTER — Ambulatory Visit: Payer: 59 | Attending: Emergency Medicine | Admitting: Physical Therapy

## 2020-11-25 ENCOUNTER — Ambulatory Visit: Payer: 59

## 2020-11-25 ENCOUNTER — Encounter: Payer: Self-pay | Admitting: Physical Therapy

## 2020-11-25 DIAGNOSIS — R471 Dysarthria and anarthria: Secondary | ICD-10-CM

## 2020-11-25 DIAGNOSIS — R41842 Visuospatial deficit: Secondary | ICD-10-CM | POA: Diagnosis present

## 2020-11-25 DIAGNOSIS — R41844 Frontal lobe and executive function deficit: Secondary | ICD-10-CM | POA: Insufficient documentation

## 2020-11-25 DIAGNOSIS — R2689 Other abnormalities of gait and mobility: Secondary | ICD-10-CM | POA: Diagnosis present

## 2020-11-25 DIAGNOSIS — R278 Other lack of coordination: Secondary | ICD-10-CM | POA: Insufficient documentation

## 2020-11-25 DIAGNOSIS — R262 Difficulty in walking, not elsewhere classified: Secondary | ICD-10-CM | POA: Diagnosis present

## 2020-11-25 DIAGNOSIS — R41840 Attention and concentration deficit: Secondary | ICD-10-CM | POA: Insufficient documentation

## 2020-11-25 DIAGNOSIS — R1310 Dysphagia, unspecified: Secondary | ICD-10-CM | POA: Insufficient documentation

## 2020-11-25 DIAGNOSIS — M25511 Pain in right shoulder: Secondary | ICD-10-CM | POA: Diagnosis present

## 2020-11-25 DIAGNOSIS — M6281 Muscle weakness (generalized): Secondary | ICD-10-CM | POA: Insufficient documentation

## 2020-11-25 DIAGNOSIS — R2681 Unsteadiness on feet: Secondary | ICD-10-CM

## 2020-11-25 NOTE — Therapy (Signed)
[facility] [address] [place], [place], [address] Phone: [phone]   Fax:  [phone]  Speech Language Pathology Evaluation  Patient Details  Name: [name] MRN: [id number] Date of Birth: February 24, 1961 Referring Provider (SLP): Nelwyn Salisbury, MD   Encounter Date: 11/25/2020   End of Session - 11/25/20 1545    Visit Number 1    Number of Visits 13    Date for SLP Re-Evaluation 02/17/21    SLP Start Time 1318    SLP Stop Time  1403    SLP Time Calculation (min) 45 min    Activity Tolerance Patient tolerated treatment well           History reviewed. No pertinent past medical history.  History reviewed. No pertinent surgical history.  There were no vitals filed for this visit.   Subjective Assessment - 11/25/20 1338    Subjective "He says there will likely be SOME return." "I have problems with my /s/'s"    Patient is accompained by: Family member   French Ana - wife             SLP Evaluation [facility] - 11/25/20 1340      SLP Visit Information   SLP Received On 11/25/20    Referring Provider (SLP) Nelwyn Salisbury, MD    Onset Date 10-08-20    Medical Diagnosis Acoustic Neuroma      Subjective   Patient/Family Stated Goal Improve speech clarity      Pain Assessment   Currently in Pain? No/denies      General Information   HPI Pt s/p CVA in 2013 folloiwng sx for VP shunt and partial resection of acoustic neuroma. 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 Pt presented for repeat resection on 10-08-20 ("They got it all") however pt left with rt facial/labial/eyelid droop. 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 Pt arrives for therapy for  dysarthria.      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      Verbal Expression   Overall Verbal Expression Appears within functional limits for tasks assessed      Oral Motor/Sensory Function   Overall Oral Motor/Sensory Function Impaired    Labial ROM Reduced right    Labial Symmetry Abnormal symmetry right    Labial Strength Reduced Right    Labial Sensation Reduced Right    Labial Coordination Reduced    Lingual ROM Within Functional Limits    Lingual Symmetry Within Functional Limits    Lingual Strength Within Functional Limits    Lingual Coordination Reduced    Facial ROM Reduced right    Facial Symmetry Right droop;Right drooping  eyelid    Facial Sensation Reduced   rt side   Facial Coordination Reduced   rt side     Motor Speech   Overall Motor Speech Impaired    Articulation Impaired    Level of Impairment Word    Intelligibility Intelligible    Motor Planning Witnin functional limits    Effective Techniques Slow rate;Over-articulate   pt spontaneously did thisx2 when asked to repeat; and x4 without request                          SLP Education - 11/25/20 1426    Education Details results of eval and main goal of therapy to better habitualize compensations for speech intelligibility, SLP suspects primarily nerve damage as reason for his dysarthria, sounds most affected (s, z, sh, zh), presence  of worsening dysarthria at end of day gives reason to perform HEP for strength of rt labial structures,    Person(s) Educated Patient;Spouse    Methods Explanation;Demonstration;Verbal cues;Handout    Comprehension Verbalized understanding;Returned demonstration;Verbal cues required;Need further instruction            SLP Short Term Goals - 11/25/20 1556      SLP SHORT TERM GOAL #1   Title pt will complete speech HEP for incr'd strength with rare min A    Time 2    Period Weeks    Status New      SLP SHORT TERM GOAL #2   Title pt will undergo bedside swallow eval    Time 2    Period Weeks    Status New      SLP SHORT TERM GOAL #3   Title pt will ube scheduled for objective swallow eval, if clinicaly indicated    Time 2    Period Weeks    Status New            SLP Long Term Goals - 11/25/20 1553      SLP LONG TERM GOAL #1   Title pt will perform HEP for oral motor muscle strength (labial structures) with modified independence in 3 sessions    Time 6    Period Weeks   or 13 total visits   Status New      SLP LONG TERM GOAL #2   Title pt will demo speech compensations 75% of the time in order to maintain 100% intelligibility in 15 minutes mod complex conversation in 3 sessions    Time 6    Period Weeks    Status New      SLP LONG TERM GOAL #3   Title in last ST session, pt will report decr'd speech related QOL than in first therapy session    Time 6    Period Weeks    Status New            Plan - 11/25/20 1546    Clinical Impression Statement Pt presetns with 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 Pt did not endorse any swallowing deficits today but will be asked in first therapy session. Objective assessment of swallowing may be necessary during this therapy course. Entire rt side of pt's face is flaccid, pt has only recently begun to regain sensation in his lateral rt forehead and rt submandibular region.  Pt with no motor movement of rt sided facial musculature with any speech or non-speech oral motor tasks. SLP believes pt's deficit is primarily nerve-based but wife states  pt demonstrates fatigue in that "his cheek seems more loose" in the late afternoon and early evening so strength may be part of pt's dysarthria. SLP provided pt a HEP to work with pt's rt labial musculature in order to improve pt articulation to PLOF, which is pt's goal. SLP will also target pt's habitual compensation strategy of reduced rate/overarticulation - which pt performed spontaneously both when asked for repeat of verbal message and when pt perceived his verbal output was not clear enough. Pt stated upon direct questioning by SLP that he is willing to complete HEP for oral strength as well as work with habitulaizing speech compensations. SLP to target these areas in skilled ST    Speech Therapy Frequency 2x / week   for two weeks, then once/week x4 weeks   Treatment/Interventions Aspiration precaution training;Pharyngeal strengthening exercises;Trials of upgraded texture/liquids;Diet toleration management by SLP;Cueing hierarchy;Patient/family education;Internal/external aids;Compensatory strategies;SLP instruction and feedback;Functional tasks    Potential to Achieve Goals Fair    Potential Considerations Co-morbidities   nerve damage (temporary or permanent)   SLP Home Exercise Plan provided today    Consulted and Agree with Plan of Care Patient           Patient will benefit from skilled therapeutic intervention in order to improve the following deficits and impairments:   Dysarthria and anarthria  Dysphagia, unspecified type    Problem List There are no problems to display for this patient.   [facility] ,[place], [place]  11/25/2020, 4:00 PM  [facility] [address] [place], [place], [address] Phone: [phone]   Fax:  [phone]  Name: Javen Hinderliter MRN: [id number] Date of Birth: 1961/10/16

## 2020-11-25 NOTE — Patient Instructions (Addendum)
LIP and TONGUE exercises  --- DO ALL EXERCISES TWICE EACH DAY  1. Lip press - Press your lips together firmly (like making a /m/ sound) and hold 5 seconds -repeat 15 times  2. LOUD and LONG Pryor Montes - make a kissing sound as loud as you can, as long as you can - repeat 15 times  3. Tongue sweep - Sweep your tongue in the pockets of your mouth - touch every tooth  - five revolutions each way  4.  PEE/PAY/PIE - 5 times       MAKE THE SOUNDS STRONG      SEE/SAY/SIGH - 5 times      SHE/SHY/SHOE - 5 times  5. Move a lollipop from side to side in your mouth, as best you can - 10 back and forth movements  6. Say "OOOOO", and "EEEEE" 10 times (Kiss and smile)  7. Put air in your cheeks, and push on either side 10 times  *KEEP THE AIR IN and DON'T LET IT ESCAPE!!*

## 2020-11-25 NOTE — Therapy (Signed)
[facility] [address] [place], [place], [address] Phone: [phone]   Fax:  [phone]  Physical Therapy Treatment  Patient Details  Name: [name] MRN: [id number] Date of Birth: Apr 23, 1961 Referring Provider (PT): Perrin Maltese, [place]   Encounter Date: 11/25/2020   PT End of Session - 11/25/20 1535    Visit Number 9    Number of Visits 13    Authorization Type [employer] - 30 VL between PT and OT (will start over at beginning of year)    Authorization - Visit Number 8    Authorization - Number of Visits 30    PT Start Time 1406   pt running late from speech therapy   PT Stop Time 1445    PT Time Calculation (min) 39 min    Equipment Utilized During Treatment Gait belt    Activity Tolerance Patient tolerated treatment well    Behavior During Therapy [name] for tasks assessed/performed           History reviewed. No pertinent past medical history.  History reviewed. No pertinent surgical history.  There were no vitals filed for this visit.   Subjective Assessment - 11/25/20 1413    Subjective Tandem stance with the R leg back is still challenging, gets frustrated with it sometimes.    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    Currently in Pain? No/denies    Pain Onset More than a month ago                             [facility] Adult PT Treatment/Exercise - 11/25/20 0001      Therapeutic Activites    Therapeutic Activities Other Therapeutic Activities    Other Therapeutic Activities at beginning of session pt discussing frustration with tandem stance with RLE posteriorly with therapist and how he can't perform without holding  on at home, discussed with pt that he is still working on balance strategies even if he cant perform without holding on and discussed improvements in his tandem stance and that each pt's progress is different in regards to different balance activities               Balance Exercises - 11/25/20 0001      Balance Exercises: Standing   Stepping Strategy Anterior;Posterior;Limitations    Stepping Strategy Limitations on rockerboard: x12 reps alternating stepping strategy each direction, incr difficulty lifting R foot back on board, intermittent min A for balance    Rockerboard Anterior/posterior;Head turns;EO    Rockerboard Limitations 2 x 5 reps head turns, 2 x 5 reps head nods, min A at times due to pt losing balance posteriorly    Tandem Gait Forward;Foam/compliant surface;4 reps    Tandem Gait Limitations on blue compliant mat with intermittent UE support    Marching Foam/compliant surface;Forwards;Retro;Limitations    Marching Limitations on blue compliant mat, down and back x4 reps forwards/backwards, intermittent min A esp with retro gait   Sit to Stand Upper extremity support;Foam/compliant surface;Limitations    Sit to Stand Limitations with eyes closed x8 reps, min guard, standing on blue mat   Other Standing Exercises up incline: narrow BOS eyes closed  x5 reps head turns, x5 reps head nods. on blue mat: feet together eyes closed x10 reps head turns, alternating marching with EO x10 reps, then performed alternating marching with eyes closed x2 reps B with min A for balance at times               PT Short Term Goals - 11/13/20 1539      PT SHORT TERM GOAL #1   Title Pt will be independent with initial HEP in order to build upon functional gains made in therapy. ALL STGS DUE 11/14/20    Baseline met, upgraded on 11/11/20    Time 3    Period Weeks    Status Achieved    Target Date 11/14/20      PT SHORT TERM GOAL #2   Title Pt will undergo further assessment of SOT - with  LTG written as appropriate.    Baseline achieved on 12/22/1 - composite score 70 and vestibular score ~30    Time 3    Period Weeks    Status Achieved      PT SHORT TERM GOAL #3   Title Pt will improve FGA score to at least a 23/30 in order to demo decr fall risk.    Baseline 21/30, 23/30 on 11/11/20    Time 3    Period Weeks    Status Achieved      PT SHORT TERM GOAL #4   Title Pt will improve 5x sit <> stand to 13 seconds or less in order to demo improved BLE strength.    Baseline 16.34 seconds, 12.75 seconds on 11/11/20    Time 3    Period Weeks    Status Achieved      PT SHORT TERM GOAL #5   Title Pt will perform 8 steps with no handrail and reciprocal pattern with supervision in order to demo improved BLE strength and balance    Baseline met on 11/11/20    Period Weeks    Status Achieved             PT Long Term Goals - 11/13/20 1539      PT LONG TERM GOAL #1   Title Pt will be independent with final HEP in order to build upon functional gains made in therapy    Time 6    Period Weeks    Status New      PT LONG TERM GOAL #2   Title Pt will improve SOT score for vestibular for sensory analysis to at least 50 in order to demo improved vestibular input for balance.    Baseline 30 on 11/13/20    Time 6    Period Weeks    Status Revised      PT LONG TERM GOAL #3   Title Pt will improve FGA score to at least a 25/30 in order to demo decr fall risk.    Baseline 21/30    Time 6    Period Weeks    Status New      PT LONG TERM GOAL #4   Title Pt will improve condition 4 of mCTSIB to at least 10 seconds to demo improved vestibular input for balance.    Baseline 2 seconds.    Time 6    Period Weeks    Status New      PT LONG TERM GOAL #5   Title Pt will ambulate at least 500' outdoors over unlevel surfaces with supervision in order to demo improved  community mobility.    Time 6    Period Weeks    Status New                 Plan - 11/25/20 1537     Clinical Impression Statement Continued to focus on balance strategies with incr vestibular input, SLS, head motions, and narrow BOS. Pt tolerated session well, needing intermittent min A esp when on rocking board and performing retro marching on a compliant surface. Pt demonstrating improvement with balance with decr UE support with incr reps. Will continue to progress towards LTGs.    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), past medical history of PE (in 2016, off DOAC) hypercholesteremia, hypertension    Examination-Activity Limitations Locomotion Level;Stairs;Transfers    Examination-Participation Restrictions Occupation;Community Activity;Yard Work   Community education officer Evolving/Moderate complexity    Rehab Potential Good    PT Frequency 2x / week    PT Duration 6 weeks    PT Treatment/Interventions ADLs/Self Care Home Management;Gait training;Stair training;Functional mobility training;Therapeutic activities;Therapeutic exercise;Balance training;Neuromuscular re-education;Patient/family education;Vestibular;Visual/perceptual remediation/compensation    PT Next Visit Plan try some BOSU on black side? corner balance for vestibular input, SLS, tandem gait/marching.   high level balance over compliant surfaces, functional BLE strength (esp RLE)    Consulted and Agree with Plan of Care Patient   wife Marzetta Board          Patient will benefit from skilled therapeutic intervention in order to improve the following deficits and impairments:  Abnormal gait,Decreased balance,Decreased activity tolerance,Decreased coordination,Decreased strength,Impaired vision/preception,Difficulty walking,Dizziness  Visit Diagnosis: Unsteadiness on feet  Other abnormalities of gait and mobility  Muscle weakness (generalized)  Difficulty in walking, not elsewhere  classified     Problem List There are no problems to display for this patient.   Arliss Journey, PT, DPT  11/25/2020, 3:38 PM  [facility] [address] [place], [place], [address] Phone: [phone]   Fax:  [phone]  Name: [name] MRN: [id number] Date of Birth: 10-04-1961

## 2020-11-27 ENCOUNTER — Other Ambulatory Visit: Payer: Self-pay

## 2020-11-27 ENCOUNTER — Encounter: Payer: Self-pay | Admitting: Speech Pathology

## 2020-11-27 ENCOUNTER — Ambulatory Visit: Payer: 59 | Admitting: Physical Therapy

## 2020-11-27 ENCOUNTER — Ambulatory Visit: Payer: 59 | Admitting: Speech Pathology

## 2020-11-27 ENCOUNTER — Encounter: Payer: Self-pay | Admitting: Occupational Therapy

## 2020-11-27 ENCOUNTER — Ambulatory Visit: Payer: 59 | Admitting: Occupational Therapy

## 2020-11-27 ENCOUNTER — Encounter: Payer: Self-pay | Admitting: Physical Therapy

## 2020-11-27 DIAGNOSIS — R41842 Visuospatial deficit: Secondary | ICD-10-CM

## 2020-11-27 DIAGNOSIS — R2681 Unsteadiness on feet: Secondary | ICD-10-CM

## 2020-11-27 DIAGNOSIS — R1310 Dysphagia, unspecified: Secondary | ICD-10-CM

## 2020-11-27 DIAGNOSIS — M6281 Muscle weakness (generalized): Secondary | ICD-10-CM

## 2020-11-27 DIAGNOSIS — R262 Difficulty in walking, not elsewhere classified: Secondary | ICD-10-CM

## 2020-11-27 DIAGNOSIS — R471 Dysarthria and anarthria: Secondary | ICD-10-CM

## 2020-11-27 DIAGNOSIS — R278 Other lack of coordination: Secondary | ICD-10-CM

## 2020-11-27 DIAGNOSIS — R41844 Frontal lobe and executive function deficit: Secondary | ICD-10-CM

## 2020-11-27 DIAGNOSIS — R41840 Attention and concentration deficit: Secondary | ICD-10-CM

## 2020-11-27 DIAGNOSIS — R2689 Other abnormalities of gait and mobility: Secondary | ICD-10-CM

## 2020-11-27 NOTE — Patient Instructions (Signed)
   Energy conservation - if you have to talk at night, rest during the day  Eat when you are more awake or alert    Get the persons attention before you speak  Use eye contact and face the person you are speaking to  Be in close proximity to the person you are speaking to  Turn down any noise in the environment such as the TV, walk away from loud appliances, air conditioners, fans, dish washers etc  Read aloud focusing on over enunciation   Speech exercises - do 5x each, x2-3/day SLOW BIG  SAY THE FOLLOWING- make every sound! Red leather, yellow leather     Purple baby carriage    [place] [employer] copper coffee pot Ripe purple cabbage Three free throws [place] [employer], Blue Bulb Flash Message Six Thick Thistles Stick Double Bubble Gum Cinnamon aluminum linoleum Black bugs blood Lovely lemon linament Tying Tape Takes Time A Shifty Salt Shaker   Four floors to cover Unique [place] A Three Toed Tree Toad Knapsack Strap Snap Rubber Baby Buggy Bumpers [place]  [place] [employer]ors Sailed the [place] Which Wrist Watches are [place] Wrist Watches

## 2020-11-27 NOTE — Therapy (Signed)
[facility] [address] [place], [place], [address] Phone: [phone]   Fax:  [phone]  Physical Therapy Treatment  Patient Details  Name: [name] MRN: [id number] Date of Birth: 07-Sep-1961 Referring Provider (PT): Perrin Maltese, [place]   Encounter Date: 11/27/2020   PT End of Session - 11/27/20 1457    Visit Number 10    Number of Visits 13    Authorization Type [employer] - 30 VL between PT and OT (will start over at beginning of year)    Authorization - Visit Number 9    Authorization - Number of Visits 30    PT Start Time 3716    PT Stop Time 1443    PT Time Calculation (min) 41 min    Equipment Utilized During Treatment Gait belt    Activity Tolerance Patient tolerated treatment well    Behavior During Therapy [facility] for tasks assessed/performed           History reviewed. No pertinent past medical history.  History reviewed. No pertinent surgical history.  There were no vitals filed for this visit.   Subjective Assessment - 11/27/20 1404    Subjective Wearing the eye patch today. Feels like his balance is getting better and is more confident with his balance    Patient is accompained by: Family member   wife, Marzetta Board   Pertinent History R acoustic neuroma (s/p resection on 10/08/20), past medical history of PE (in 2016, off DOAC) hypercholesteremia, hypertension    Patient Stated Goals wants to be able to move better and walk better, more fluid pattern.    Currently in Pain? No/denies    Pain Onset More than a month ago                                  Balance Exercises - 11/27/20 1417      Balance Exercises: Standing   Standing Eyes Closed Narrow base of support (BOS);Foam/compliant surface;4 reps;30 secs    Standing Eyes Closed Limitations on blue air ex feet together, then with more narrow BOS eyes closed 2x10 reps head turns, 2x10 reps head nods    SLS with  Vectors Foam/compliant surface;Intermittent upper extremity assist    SLS with Vectors Limitations at bottom of stair case alternating toe taps to 1st 6" step, 2 x 5 reps B - cues for slowed and controlled and gentle tap    Stepping Strategy Posterior;Foam/compliant surface;10 reps;Limitations    Stepping Strategy Limitations on blue balance beam, alternating    Rockerboard Anterior/posterior    Rockerboard Limitations in corner: holding ball, performing lifts and chops while following ball with head/eyes 2 x 5 reps B, incr difficulty looking up to R and down to L, min guard and frequent taps to wall for balance    Partial Tandem Stance Eyes open;Foam/compliant surface;1 rep;30 secs    Partial Tandem Stance Limitations then performed with each leg behind, x10 reps ball toss with PT aide, min guard occassionally for balance               PT Short Term Goals - 11/13/20 1539      PT SHORT TERM GOAL #1   Title Pt will be independent with initial HEP in order to build upon functional gains made in therapy. ALL STGS DUE 11/14/20    Baseline met, upgraded on 11/11/20    Time 3  Period Weeks    Status Achieved    Target Date 11/14/20      PT SHORT TERM GOAL #2   Title Pt will undergo further assessment of SOT - with LTG written as appropriate.    Baseline achieved on 12/22/1 - composite score 70 and vestibular score ~30    Time 3    Period Weeks    Status Achieved      PT SHORT TERM GOAL #3   Title Pt will improve FGA score to at least a 23/30 in order to demo decr fall risk.    Baseline 21/30, 23/30 on 11/11/20    Time 3    Period Weeks    Status Achieved      PT SHORT TERM GOAL #4   Title Pt will improve 5x sit <> stand to 13 seconds or less in order to demo improved BLE strength.    Baseline 16.34 seconds, 12.75 seconds on 11/11/20    Time 3    Period Weeks    Status Achieved      PT SHORT TERM GOAL #5   Title Pt will perform 8 steps with no handrail and reciprocal  pattern with supervision in order to demo improved BLE strength and balance    Baseline met on 11/11/20    Period Weeks    Status Achieved             PT Long Term Goals - 11/27/20 1500      PT LONG TERM GOAL #1   Title Pt will be independent with final HEP in order to build upon functional gains made in therapy ALL LTGS DUE 12/05/20    Time 6    Period Weeks    Status New      PT LONG TERM GOAL #2   Title Pt will improve SOT score for vestibular for sensory analysis to at least 50 in order to demo improved vestibular input for balance.    Baseline 30 on 11/13/20    Time 6    Period Weeks    Status Revised      PT LONG TERM GOAL #3   Title Pt will improve FGA score to at least a 25/30 in order to demo decr fall risk.    Baseline 21/30    Time 6    Period Weeks    Status New      PT LONG TERM GOAL #4   Title Pt will improve condition 4 of mCTSIB to at least 10 seconds to demo improved vestibular input for balance.    Baseline 2 seconds, 30 seconds on 11/27/20    Time 6    Period Weeks    Status Achieved      PT LONG TERM GOAL #5   Title Pt will ambulate at least 500' outdoors over unlevel surfaces with supervision in order to demo improved community mobility.    Time 6    Period Weeks    Status New                 Plan - 11/27/20 1500    Clinical Impression Statement Pt improving with vestibular input for balance - able to perform condition 4 of mCTSIB for 30 seconds (previously 2 seconds), and has met LTG #4. Remainder of session focused on additional balance work on compliant surfaces with narrow BOS, head motions/visual tracking, and SLS. Pt with incr difficulty with diagonal head turns looking up to R and down to L. Intermittent  seated rest breaks taken. Will continue to progress towards LTGs.    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), past medical  history of PE (in 2016, off DOAC) hypercholesteremia, hypertension    Examination-Activity Limitations Locomotion Level;Stairs;Transfers    Examination-Participation Restrictions Occupation;Community Activity;Yard Work   Community education officer Evolving/Moderate complexity    Rehab Potential Good    PT Frequency 2x / week    PT Duration 6 weeks    PT Treatment/Interventions ADLs/Self Care Home Management;Gait training;Stair training;Functional mobility training;Therapeutic activities;Therapeutic exercise;Balance training;Neuromuscular re-education;Patient/family education;Vestibular;Visual/perceptual remediation/compensation    PT Next Visit Plan try some BOSU on black side for balance.start checking LTGs - D/C vs. re-cert?    Consulted and Agree with Plan of Care Patient   wife Marzetta Board          Patient will benefit from skilled therapeutic intervention in order to improve the following deficits and impairments:  Abnormal gait,Decreased balance,Decreased activity tolerance,Decreased coordination,Decreased strength,Impaired vision/preception,Difficulty walking,Dizziness  Visit Diagnosis: Unsteadiness on feet  Other abnormalities of gait and mobility  Muscle weakness (generalized)  Difficulty in walking, not elsewhere classified  Other lack of coordination     Problem List There are no problems to display for this patient.   Arliss Journey, PT, DPT  11/27/2020, 3:07 PM  [facility] [address] [place], [place], [address] Phone: [phone]   Fax:  [phone]  Name: Bridget Westbrooks MRN: [id number] Date of Birth: 1961/05/18

## 2020-11-27 NOTE — Therapy (Signed)
[facility] [address] [place], [place], [address] Phone: [phone]   Fax:  [phone]  Speech Language Pathology Treatment  Patient Details  Name: [name] MRN: [id number] Date of Birth: [birth year] Referring Provider (SLP): Nelwyn Salisbury, MD   Encounter Date: 11/27/2020   End of Session - 11/27/20 1507    Visit Number 2    Number of Visits 13    Date for SLP Re-Evaluation 02/17/21    SLP Start Time 1233    SLP Stop Time  1312    SLP Time Calculation (min) 39 min           History reviewed. No pertinent past medical history.  History reviewed. No pertinent surgical history.  There were no vitals filed for this visit.   Subjective Assessment - 11/27/20 1240    Subjective "I cough a lot, but not when I lay down"    Currently in Pain? No/denies                 ADULT SLP TREATMENT - 11/27/20 1241      General Information   Behavior/Cognition Alert;Cooperative;Pleasant mood      Treatment Provided   Treatment provided Cognitive-Linquistic      Cognitive-Linquistic Treatment   Treatment focused on Dysarthria;Patient/family/caregiver education    Skilled Treatment Pt denies coughing, choking, tearing or sneezing during meals. Denies difficulty with meds. Bedside swallow screen with thin liquids and hard solid (granola bar) revealed no overt s/s of aspiration. He denies pocketing, however spontaneously swished with subsequent liquid sips to clear oral residue successfully. He is not avoiding any foods. Structured speech tasks targeting compensations for dysarthria with rare min A. Trained pt in environmental controls lto improve intellgibility with family. See pt instructions. Rich demonstrated HEP for labial strength with rare min A      Assessment / Recommendations / Plan   Plan Continue with current plan of care      Dysphagia Recommendations   Diet recommendations Dysphagia 3  (mechanical soft);Thin liquid    Liquids provided via Cup;Straw    Medication Administration Whole meds with liquid    Supervision Patient able to self feed    Compensations Slow rate;Small sips/bites;Check for pocketing      Progression Toward Goals   Progression toward goals Progressing toward goals            SLP Education - 11/27/20 1310    Education Details Environmental compensations for intelligilbity and hearing, HEP for facial weakness, compensation,    Person(s) Educated Patient    Methods Explanation;Demonstration;Verbal cues;Handout    Comprehension Verbalized understanding;Returned demonstration;Verbal cues required            SLP Short Term Goals - 11/27/20 1506      SLP SHORT TERM GOAL #1   Title pt will complete speech HEP for incr'd strength with rare min A    Time 2    Period Weeks    Status On-going      SLP SHORT TERM GOAL #2   Title pt will undergo bedside swallow eval    Time 2    Period Weeks    Status Achieved      SLP SHORT TERM GOAL #3   Title pt will ube scheduled for objective swallow eval, if clinicaly indicated    Time 2    Period Weeks    Status On-going            SLP Long Term Goals -  11/27/20 1507      SLP LONG TERM GOAL #1   Title pt will perform HEP for oral motor muscle strength (labial structures) with modified independence in 3 sessions    Time 6    Period Weeks   or 13 total visits   Status On-going      SLP LONG TERM GOAL #2   Title pt will demo speech compensations 75% of the time in order to maintain 100% intelligibility in 15 minutes mod complex conversation in 3 sessions    Time 6    Period Weeks    Status On-going      SLP LONG TERM GOAL #3   Title in last ST session, pt will report decr'd speech related QOL than in first therapy session    Time 6    Period Weeks    Status On-going            Plan - 11/27/20 1503    Clinical Impression Statement Right side of face remains flaccid, with some regain of  sensation in right forehead and right submandibular regoin. Ongoing training in HEP and compensatory strategies to maximize intellgibility. No overt s/s of aspiration with hard solid and thin liquids. Pt spontaneously cleared right buccal residue with liquid sip. Denies difficulty swallowing pills. Continue skilled ST to maximize intelligibility.    Speech Therapy Frequency 2x / week   for 2 weeks, then 1x a week for 4 weeks   Treatment/Interventions Aspiration precaution training;Pharyngeal strengthening exercises;Trials of upgraded texture/liquids;Diet toleration management by SLP;Cueing hierarchy;Patient/family education;Internal/external aids;Compensatory strategies;SLP instruction and feedback;Functional tasks    Potential to Achieve Goals Good    Potential Considerations Co-morbidities           Patient will benefit from skilled therapeutic intervention in order to improve the following deficits and impairments:   Dysarthria and anarthria  Dysphagia, unspecified type    Problem List There are no problems to display for this patient.   Tonica Brasington, Radene Journey MS, CCC-SLP 11/27/2020, 3:08 PM  [facility] [address] [place], [place], [address] Phone: [phone]   Fax:  [phone]   Name: [name] MRN: [id number] Date of Birth: [birth year]

## 2020-11-27 NOTE — Therapy (Signed)
[facility] [address] [place], [place], [address] Phone: [phone]   Fax:  [phone]  Occupational Therapy Treatment  Patient Details  Name: [name] MRN: [id number] Date of Birth: 1961-02-07 No data recorded  Encounter Date: 11/27/2020   OT End of Session - 11/27/20 1323    Visit Number 5    Number of Visits 13    Date for OT Re-Evaluation 12/09/20    Authorization Type [employer]    Authorization Time Period VL 30 combined PT/OT    OT Start Time 1321    OT Stop Time 1400    OT Time Calculation (min) 39 min    Activity Tolerance Patient tolerated treatment well    Behavior During Therapy [facility] for tasks assessed/performed           History reviewed. No pertinent past medical history.  History reviewed. No pertinent surgical history.  There were no vitals filed for this visit.   Subjective Assessment - 11/27/20 1324    Subjective  "The only difference is I am starting to get more control of my eye again."    Patient is accompanied by: Family member   Erline Levine, spouse   Pertinent History PMH hypercholesteremia, HTN, Acoustic Neuroma, VP shunt, Bell's Palsy    Limitations Fall Risk    Patient Stated Goals using tool like a hammer    Currently in Pain? No/denies                        OT Treatments/Exercises (OP) - 11/27/20 1329      Work Hardening Exercises   Stationary Bike arm bike x 5 minutes level 1 with BUE with no reports of pain. completed for conditioning      Fine Motor Coordination (Hand/Wrist)   Fine Motor Coordination Stacking coins;Manipulating coins;Picking up coins;In hand manipuation training;Small Pegboard    In Hand Manipulation Training small pegs - picking up 4 at a time and translating one at a time to the fingertips and placing into pegboard. Pt with ocassional drops (approx 25% of the time). removed with in hand manipulation.    Picking up coins pennies and  stacking in stacks of 5 with RUE with moderate drops. Pt picked up 5 pennies at a time and translated one at a time to fingertips for placing into piggy bank with in hand manipulation with RUE.                    OT Short Term Goals - 11/20/20 1032      OT SHORT TERM GOAL #1   Title Pt will be independent with HEP    Time 3    Period Weeks    Status On-going    Target Date 12/09/20      OT SHORT TERM GOAL #2   Title Pt will demonstrate increased fine motor coordination as evidenced by increasing 9 hole peg test score by at least 5 seconds with RUE.    Baseline RUE 60.16    Time 3    Period Weeks    Status On-going      OT SHORT TERM GOAL #3   Title Pt will complete environmental scanning with 80% accuracy with partial occlusion PRN and decrease in reports of diplopia.    Time 3    Period Weeks    Status Achieved   80%     OT SHORT TERM GOAL #4   Title Pt will  verbalize understanding of visual strategies for diplopia other visual disturbances    Time 3    Period Weeks    Status New      OT SHORT TERM GOAL #5   Title Pt will increase range of motion in RUE shoulder to at least 120 degrees in order to obtain item from overhead shelf with pain less than or equal to 6/10    Baseline pain 7/10 115 degrees    Time 3    Period Weeks    Status New             OT Long Term Goals - 10/28/20 1534      OT LONG TERM GOAL #1   Title Pt will be independent with updated HEP 12/09/2020    Time 6    Period Weeks    Status New    Target Date 12/09/20      OT LONG TERM GOAL #2   Title Pt will report increase ease with use of hand held tools (i.e. hammer) with RUE, dominant hand and with mod I    Time 6    Period Weeks    Status New      OT LONG TERM GOAL #3   Title Pt will demonstrate increased fine motor coordination with increased 9 hole peg test score of at least 10 seconds    Baseline RUE 60.16    Time 6    Period Weeks    Status New      OT LONG TERM GOAL #4    Title Pt will perform Box and Blocks test of 32 blocks or more with RUE for increase in functional use.    Baseline RUE 27    Time 6    Period Weeks    Status New      OT LONG TERM GOAL #5   Title Pt will perform handwriting of one sentence with legibility of 90% and in appropriate time with right, dominant, hand.    Baseline 50% legibility with increased time    Time 6    Period Weeks    Status New      OT LONG TERM GOAL #6   Title Pt will perform environmental scanning with 100% accuracy with partial occlusion PRN and no reports or complaints of diplopia    Time 6    Period Weeks    Status New                 Plan - 11/27/20 1412    Clinical Impression Statement Pt is progresing towards goals. Pt continues to make progress towards RUE coordination. Pt is experiencing more control of R eye s/p surgery and botox wearing off. Diplopia continues to be present when eye is open.    OT Occupational Profile and History Detailed Assessment- Review of Records and additional review of physical, cognitive, psychosocial history related to current functional performance    Occupational performance deficits (Please refer to evaluation for details): IADL's;ADL's;Social Participation;Rest and Sleep;Leisure;Education    Body Structure / Function / Physical Skills ADL;Balance;Coordination;FMC;Flexibility;Dexterity;Strength;Pain;GMC;UE functional use;ROM;IADL;Vision    Cognitive Skills Attention;Problem Solve;Thought;Learn;Understand;Perception    Rehab Potential Fair    Clinical Decision Making Limited treatment options, no task modification necessary    Comorbidities Affecting Occupational Performance: None    Modification or Assistance to Complete Evaluation  No modification of tasks or assist necessary to complete eval    OT Frequency 2x / week    OT Duration 6 weeks   or  12 visits + eval over extended time d/t scheduling conflicts   OT Treatment/Interventions Balance training;Moist  Heat;Fluidtherapy;Self-care/ADL training;Therapeutic activities;Cognitive remediation/compensation;Therapeutic exercise;Electrical Stimulation;Cryotherapy;Energy conservation;Neuromuscular education;Functional Mobility Training;Passive range of motion;Visual/perceptual remediation/compensation;Patient/family education;Manual Therapy    Plan continue to assess current vision with R eye and status of eye opening. RUE coordination    Recommended Other Services Pt has referral for ST and is currently receiving PT    Consulted and Agree with Plan of Care Patient;Family member/caregiver    Family Member Consulted spouse, Misty Stanley           Patient will benefit from skilled therapeutic intervention in order to improve the following deficits and impairments:   Body Structure / Function / Physical Skills: ADL,Balance,Coordination,FMC,Flexibility,Dexterity,Strength,Pain,GMC,UE functional use,ROM,IADL,Vision Cognitive Skills: Attention,Problem Solve,Thought,Learn,Understand,Perception     Visit Diagnosis: Unsteadiness on feet  Other abnormalities of gait and mobility  Muscle weakness (generalized)  Visuospatial deficit  Frontal lobe and executive function deficit  Attention and concentration deficit  Other lack of coordination    Problem List There are no problems to display for this patient.   Junious Dresser MOT, OTR/L  11/27/2020, 2:20 PM  [facility] [address] [place], [place], [address] Phone: [phone]   Fax:  [phone]  Name: Seng Larch MRN: [id number] Date of Birth: 1961-08-29

## 2020-12-02 ENCOUNTER — Encounter: Payer: Self-pay | Admitting: Occupational Therapy

## 2020-12-02 ENCOUNTER — Ambulatory Visit: Payer: 59 | Admitting: Occupational Therapy

## 2020-12-02 ENCOUNTER — Encounter: Payer: Self-pay | Admitting: Speech Pathology

## 2020-12-02 ENCOUNTER — Other Ambulatory Visit: Payer: Self-pay

## 2020-12-02 ENCOUNTER — Ambulatory Visit: Payer: 59

## 2020-12-02 ENCOUNTER — Ambulatory Visit: Payer: 59 | Admitting: Speech Pathology

## 2020-12-02 DIAGNOSIS — R2689 Other abnormalities of gait and mobility: Secondary | ICD-10-CM

## 2020-12-02 DIAGNOSIS — R471 Dysarthria and anarthria: Secondary | ICD-10-CM

## 2020-12-02 DIAGNOSIS — M6281 Muscle weakness (generalized): Secondary | ICD-10-CM

## 2020-12-02 DIAGNOSIS — R278 Other lack of coordination: Secondary | ICD-10-CM

## 2020-12-02 DIAGNOSIS — R41842 Visuospatial deficit: Secondary | ICD-10-CM

## 2020-12-02 DIAGNOSIS — M25511 Pain in right shoulder: Secondary | ICD-10-CM

## 2020-12-02 DIAGNOSIS — R2681 Unsteadiness on feet: Secondary | ICD-10-CM | POA: Diagnosis not present

## 2020-12-02 DIAGNOSIS — R41840 Attention and concentration deficit: Secondary | ICD-10-CM

## 2020-12-02 DIAGNOSIS — R41844 Frontal lobe and executive function deficit: Secondary | ICD-10-CM

## 2020-12-02 DIAGNOSIS — R262 Difficulty in walking, not elsewhere classified: Secondary | ICD-10-CM

## 2020-12-02 NOTE — Patient Instructions (Addendum)
   Great job using syllables to help intelligibility  longer words in - this is important   Keep up the good work talking slow and over [employer]  Good job with energy conservation - don't do the speech exercises before you eat or when you have a lot of talking to do - if you are having a heavy talking day, reduce speech exercises so you don't fatigue your mouth muscles  Rest up - it's OK to limit conversations or rest before you have to talk

## 2020-12-02 NOTE — Therapy (Signed)
[facility] [address] [place], [place], [address] Phone: [phone]   Fax:  [phone]  Speech Language Pathology Treatment  Patient Details  Name: [name] MRN: [id number] Date of Birth: Nov 03, 1961 Referring Provider (SLP): Nelwyn Salisbury, MD   Encounter Date: 12/02/2020   End of Session - 12/02/20 1412    Visit Number 3    Number of Visits 13    Date for SLP Re-Evaluation 02/17/21    SLP Start Time 1232    SLP Stop Time  1313    SLP Time Calculation (min) 41 min    Activity Tolerance Patient tolerated treatment well           History reviewed. No pertinent past medical history.  History reviewed. No pertinent surgical history.  There were no vitals filed for this visit.   Subjective Assessment - 12/02/20 1235    Subjective "I don't know" re: getting better    Currently in Pain? Yes    Pain Score 5     Pain Location Shoulder    Pain Orientation Right    Pain Descriptors / Indicators Sharp;Sore    Pain Type Chronic pain    Pain Onset More than a month ago    Pain Frequency Intermittent    Aggravating Factors  exercises                 ADULT SLP TREATMENT - 12/02/20 1238      General Information   Behavior/Cognition Alert;Cooperative;Pleasant mood      Treatment Provided   Treatment provided Cognitive-Linquistic      Cognitive-Linquistic Treatment   Treatment focused on Dysarthria;Patient/family/caregiver education    Skilled Treatment Pt continues to deny difficulty swallowing and has improved feeling in his gums and teeth and reduce buccal residue. Completed HEP for speech with rare min A. In structured speech task, [name] carried over over articulation and slow rate generating multiple meaning sentences. In generating 3 spontaneous sentences with 100% intelligiblity. In conversation, [name] carries over slow rate and over articulation with mod I. Reviewed energy  conservation as intelligible speech rerquires effort      Assessment / Recommendations / Plan   Plan Continue with current plan of care      Progression Toward Goals   Progression toward goals Progressing toward goals            SLP Education - 12/02/20 1410    Education Details compensations for dysarthria, energy conservation    Person(s) Educated Patient    Methods Explanation;Demonstration;Verbal cues;Handout    Comprehension Verbalized understanding;Returned demonstration            SLP Short Term Goals - 12/02/20 1411      SLP SHORT TERM GOAL #1   Title pt will complete speech HEP for incr'd strength with rare min A    Time 1    Period Weeks    Status On-going      SLP SHORT TERM GOAL #2   Title pt will undergo bedside swallow eval    Time 2    Period Weeks    Status Achieved      SLP SHORT TERM GOAL #3   Title pt will ube scheduled for objective swallow eval, if clinicaly indicated    Time 2    Period Weeks    Status Deferred            SLP Long Term Goals - 12/02/20 1412      SLP  LONG TERM GOAL #1   Title pt will perform HEP for oral motor muscle strength (labial structures) with modified independence in 3 sessions    Time 5    Period Weeks   or 13 total visits   Status On-going      SLP LONG TERM GOAL #2   Title pt will demo speech compensations 75% of the time in order to maintain 100% intelligibility in 15 minutes mod complex conversation in 3 sessions    Time 5    Period Weeks    Status On-going      SLP LONG TERM GOAL #3   Title in last ST session, pt will report decr'd speech related QOL than in first therapy session    Time 5    Period Weeks    Status On-going            Plan - 12/02/20 1411    Clinical Impression Statement Right side of face remains flaccid, with some regain of sensation in right forehead and right submandibular regoin. Ongoing training in HEP and compensatory strategies to maximize intellgibility. No overt s/s of  aspiration with hard solid and thin liquids. Pt spontaneously cleared right buccal residue with liquid sip. Denies difficulty swallowing pills. Continue skilled ST to maximize intelligibility.    Speech Therapy Frequency 2x / week    Treatment/Interventions Aspiration precaution training;Pharyngeal strengthening exercises;Trials of upgraded texture/liquids;Diet toleration management by SLP;Cueing hierarchy;Patient/family education;Internal/external aids;Compensatory strategies;SLP instruction and feedback;Functional tasks    Potential to Achieve Goals Good    Potential Considerations Co-morbidities           Patient will benefit from skilled therapeutic intervention in order to improve the following deficits and impairments:   Dysarthria and anarthria    Problem List There are no problems to display for this patient.   [name], Radene Journey MS, CCC-SLP 12/02/2020, 2:13 PM  [facility] [address] [place], [place], [address] Phone: [phone]   Fax:  [phone]   Name: [name] MRN: [id number] Date of Birth: 06/13/1961

## 2020-12-02 NOTE — Therapy (Signed)
[facility] [address] [place], [place], [address] Phone: [phone]   Fax:  [phone]  Occupational Therapy Treatment  Patient Details  Name: [name] MRN: [id number] Date of Birth: 1960/12/25 No data recorded  Encounter Date: 12/02/2020   OT End of Session - 12/02/20 1319    Visit Number 6    Number of Visits 13    Date for OT Re-Evaluation 12/09/20    Authorization Type [employer]    Authorization Time Period VL 30 combined PT/OT    OT Start Time 1318    OT Stop Time 1357    OT Time Calculation (min) 39 min    Activity Tolerance Patient tolerated treatment well    Behavior During Therapy [facility] for tasks assessed/performed           History reviewed. No pertinent past medical history.  History reviewed. No pertinent surgical history.  There were no vitals filed for this visit.   Subjective Assessment - 12/02/20 1319    Subjective  Pt says his shoulder is "a good 5" today on the pain spectrum    Patient is accompanied by: Family member   [name], spouse   Pertinent History PMH hypercholesteremia, HTN, Acoustic Neuroma, VP shunt, Bell's Palsy    Limitations Fall Risk    Patient Stated Goals using tool like a hammer    Currently in Pain? Yes    Pain Score 5     Pain Location Shoulder    Pain Orientation Right    Pain Descriptors / Indicators Aching;Sharp;Sore    Pain Type Chronic pain    Pain Onset More than a month ago    Pain Frequency Intermittent                        OT Treatments/Exercises (OP) - 12/02/20 1329      Neurological Re-education Exercises   Other Exercises 1 supine exercises for shoulder flexion, horiz abduction, chest press x 10 with unweighted dowel. Pt completed seated AAROM with dowel down knees to feet x 10. AROM with closed chain dowel x 10 while seated for shoulder flexion.      Fine Motor Coordination (Hand/Wrist)   Fine Motor Coordination  Manipulation of small objects;O'Connor pegs    Manipulation of small objects placing 6 small beads on 10 golf tees with RUE with moderate drops. Increased difficulty with removing with emphasis on in hand manipulation. mod difficulty    O'Connor pegs placing pegs with RUE with increased time and min drops and difficulty with coordination                    OT Short Term Goals - 11/20/20 1032      OT SHORT TERM GOAL #1   Title Pt will be independent with HEP    Time 3    Period Weeks    Status On-going    Target Date 12/09/20      OT SHORT TERM GOAL #2   Title Pt will demonstrate increased fine motor coordination as evidenced by increasing 9 hole peg test score by at least 5 seconds with RUE.    Baseline RUE 60.16    Time 3    Period Weeks    Status On-going      OT SHORT TERM GOAL #3   Title Pt will complete environmental scanning with 80% accuracy with partial occlusion PRN and decrease in reports of diplopia.  Time 3    Period Weeks    Status Achieved   80%     OT SHORT TERM GOAL #4   Title Pt will verbalize understanding of visual strategies for diplopia other visual disturbances    Time 3    Period Weeks    Status New      OT SHORT TERM GOAL #5   Title Pt will increase range of motion in RUE shoulder to at least 120 degrees in order to obtain item from overhead shelf with pain less than or equal to 6/10    Baseline pain 7/10 115 degrees    Time 3    Period Weeks    Status New             OT Long Term Goals - 10/28/20 1534      OT LONG TERM GOAL #1   Title Pt will be independent with updated HEP 12/09/2020    Time 6    Period Weeks    Status New    Target Date 12/09/20      OT LONG TERM GOAL #2   Title Pt will report increase ease with use of hand held tools (i.e. hammer) with RUE, dominant hand and with mod I    Time 6    Period Weeks    Status New      OT LONG TERM GOAL #3   Title Pt will demonstrate increased fine motor coordination with  increased 9 hole peg test score of at least 10 seconds    Baseline RUE 60.16    Time 6    Period Weeks    Status New      OT LONG TERM GOAL #4   Title Pt will perform Box and Blocks test of 32 blocks or more with RUE for increase in functional use.    Baseline RUE 27    Time 6    Period Weeks    Status New      OT LONG TERM GOAL #5   Title Pt will perform handwriting of one sentence with legibility of 90% and in appropriate time with right, dominant, hand.    Baseline 50% legibility with increased time    Time 6    Period Weeks    Status New      OT LONG TERM GOAL #6   Title Pt will perform environmental scanning with 100% accuracy with partial occlusion PRN and no reports or complaints of diplopia    Time 6    Period Weeks    Status New                 Plan - 12/02/20 1412    Clinical Impression Statement Pt continues to make progress. Continues with limited eye opening of R eye impeding ability to fully assess diplopia and current visual state.    OT Occupational Profile and History Detailed Assessment- Review of Records and additional review of physical, cognitive, psychosocial history related to current functional performance    Occupational performance deficits (Please refer to evaluation for details): IADL's;ADL's;Social Participation;Rest and Sleep;Leisure;Education    Body Structure / Function / Physical Skills ADL;Balance;Coordination;FMC;Flexibility;Dexterity;Strength;Pain;GMC;UE functional use;ROM;IADL;Vision    Cognitive Skills Attention;Problem Solve;Thought;Learn;Understand;Perception    Rehab Potential Fair    Clinical Decision Making Limited treatment options, no task modification necessary    Comorbidities Affecting Occupational Performance: None    Modification or Assistance to Complete Evaluation  No modification of tasks or assist necessary to complete eval  OT Frequency 2x / week    OT Duration 6 weeks   or 12 visits + eval over extended time d/t  scheduling conflicts   OT Treatment/Interventions Balance training;Moist Heat;Fluidtherapy;Self-care/ADL training;Therapeutic activities;Cognitive remediation/compensation;Therapeutic exercise;Electrical Stimulation;Cryotherapy;Energy conservation;Neuromuscular education;Functional Mobility Training;Passive range of motion;Visual/perceptual remediation/compensation;Patient/family education;Manual Therapy    Plan continue to assess current vision with R eye and status of eye opening. RUE coordination    Recommended Other Services Pt has referral for ST and is currently receiving PT    Consulted and Agree with Plan of Care Patient;Family member/caregiver    Family Member Consulted spouse, [name]           Patient will benefit from skilled therapeutic intervention in order to improve the following deficits and impairments:   Body Structure / Function / Physical Skills: ADL,Balance,Coordination,FMC,Flexibility,Dexterity,Strength,Pain,GMC,UE functional use,ROM,IADL,Vision Cognitive Skills: Attention,Problem Solve,Thought,Learn,Understand,Perception     Visit Diagnosis: Muscle weakness (generalized)  Frontal lobe and executive function deficit  Visuospatial deficit  Attention and concentration deficit  Other lack of coordination  Acute pain of right shoulder    Problem List There are no problems to display for this patient.   Junious Dresser MOT, OTR/L  12/02/2020, 2:13 PM  [facility] [address] [place], [place], [address] Phone: [phone]   Fax:  [phone]  Name: [name] MRN: [id number] Date of Birth: 10-10-1961

## 2020-12-02 NOTE — Therapy (Signed)
[facility] [address] [place], [place], [address] Phone: [phone]   Fax:  [phone]  Physical Therapy Treatment  Patient Details  Name: [name] MRN: [id number] Date of Birth: 10-29-1961 Referring Provider (PT): Nelwyn Salisbury, [place]   Encounter Date: 12/02/2020   PT End of Session - 12/02/20 1359    Visit Number 11    Number of Visits 13    Authorization Type [employer] - 30 VL between PT and OT (will start over at beginning of year)    Authorization - Visit Number 10    Authorization - Number of Visits 30    PT Start Time 1358    PT Stop Time 1442    PT Time Calculation (min) 44 min    Equipment Utilized During Treatment Gait belt    Activity Tolerance Patient tolerated treatment well    Behavior During Therapy [facility] for tasks assessed/performed           No past medical history on file.  No past surgical history on file.  There were no vitals filed for this visit.   Subjective Assessment - 12/02/20 1359    Subjective Wearing the eye patch today. Feels like his balance is getting better and is more confident with his balance. 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    Currently in Pain? Yes    Pain Score 5     Pain Location Neck    Pain Orientation Right    Pain Descriptors / Indicators Aching;Sharp    Pain Type Chronic pain    Pain Onset More than a month ago              [facility] PT Assessment - 12/02/20 1413      Functional Gait  Assessment   Gait assessed  Yes    Gait Level Surface Walks 20 ft in less than 5.5 sec, no assistive devices, good speed, no evidence for imbalance,  normal gait pattern, deviates no more than 6 in outside of the 12 in walkway width.    Change in Gait Speed Able to smoothly change walking speed without loss of balance or gait deviation. Deviate no more than 6 in outside of the 12 in walkway width.    Gait with Horizontal Head Turns Performs head turns smoothly with no change in gait. Deviates no more than 6 in outside 12 in walkway width    Gait with Vertical Head Turns Performs task with slight change in gait velocity (eg, minor disruption to smooth gait path), deviates 6 - 10 in outside 12 in walkway width or uses assistive device    Gait and Pivot Turn Pivot turns safely within 3 sec and stops quickly with no loss of balance.    Step Over Obstacle Is able to step over one shoe box (4.5 in total height) without changing gait speed. No evidence of imbalance.    Gait with Narrow Base of Support Ambulates 7-9 steps.    Gait with Eyes Closed Walks 20 ft, uses assistive device, slower speed, mild gait deviations, deviates 6-10 in outside 12 in walkway width. Ambulates 20 ft in less than 9 sec but  greater than 7 sec.    Ambulating Backwards Walks 20 ft, uses assistive device, slower speed, mild gait deviations, deviates 6-10 in outside 12 in walkway width.    Steps Alternating feet, must use rail.    Total Score 24    FGA comment: 24/30                         [facility] Adult PT Treatment/Exercise - 12/02/20 1410      Ambulation/Gait   Ambulation/Gait Yes    Ambulation/Gait Assistance 4: Min guard;5: Supervision    Ambulation/Gait Assistance Details Supervision overall, one instance of CGA required with patient kneeling to tie shoe on paved surface.    Ambulation Distance (Feet) 800 Feet    Assistive device None    Gait Pattern Decreased hip/knee flexion - right    Ambulation Surface Level;Unlevel;Outdoor;Paved;Grass    Stairs Yes    Stairs Assistance 5: Supervision    Stairs Assistance Details (indicate cue type and reason) with  completion of FGA, patient utilizing hand rail with alternating pattern    Stair Management Technique One rail Right;Alternating pattern;Forwards    Number of Stairs 4    Height of Stairs 6    Gait Comments minor difficulyies with small grades      Neuro Re-ed    Neuro Re-ed Details  Completed M-CTSIB: patient able to hold situation 1-4 for full 30 seconds, increased sway on situation 4 with close supervision.               Balance Exercises - 12/02/20 1427      Balance Exercises: Standing   Standing Eyes Closed Wide (BOA);Head turns;Foam/compliant surface;Limitations    Standing Eyes Closed Limitations on airex completed horiz/vert head turns 2 x 10    SLS with Vectors Foam/compliant surface;Limitations    SLS with Vectors Limitations standing on airex, completed alternating toe taps to 4" step without UE support, CGA x 15 reps. verbal cues for slowed pace and control    Balance Beam alternating stepping fwd/bwd on/off red beam 15 reps each with CGA. increased challenge with posterior stepping strategy    Marching Foam/compliant surface;Static;Limitations    Marching Limitations on airex completed alternating marching without UE support 2 x 45 seconds each. verbal cues for control and pace, cues for foot position required.             PT Education - 12/02/20 1446    Education Details progress toward LTGs.    Person(s) Educated Patient    Methods Explanation    Comprehension Verbalized understanding            PT Short Term Goals - 11/13/20 1539      PT SHORT TERM GOAL #1   Title Pt will be independent with initial HEP in order to build upon functional gains made in therapy. ALL STGS DUE 11/14/20    Baseline met, upgraded on 11/11/20    Time 3    Period Weeks    Status Achieved    Target Date 11/14/20      PT SHORT TERM GOAL #2   Title Pt will undergo further assessment of SOT - with LTG written as appropriate.    Baseline achieved on 12/22/1 - composite score 70  and vestibular score ~30    Time 3    Period Weeks    Status Achieved      PT SHORT TERM GOAL #3   Title Pt will improve FGA score  to at least a 23/30 in order to demo decr fall risk.    Baseline 21/30, 23/30 on 11/11/20    Time 3    Period Weeks    Status Achieved      PT SHORT TERM GOAL #4   Title Pt will improve 5x sit <> stand to 13 seconds or less in order to demo improved BLE strength.    Baseline 16.34 seconds, 12.75 seconds on 11/11/20    Time 3    Period Weeks    Status Achieved      PT SHORT TERM GOAL #5   Title Pt will perform 8 steps with no handrail and reciprocal pattern with supervision in order to demo improved BLE strength and balance    Baseline met on 11/11/20    Period Weeks    Status Achieved             PT Long Term Goals - 12/02/20 1419      PT LONG TERM GOAL #1   Title Pt will be independent with final HEP in order to build upon functional gains made in therapy ALL LTGS DUE 12/05/20    Time 6    Period Weeks    Status New      PT LONG TERM GOAL #2   Title Pt will improve SOT score for vestibular for sensory analysis to at least 50 in order to demo improved vestibular input for balance.    Baseline 30 on 11/13/20    Time 6    Period Weeks    Status Revised      PT LONG TERM GOAL #3   Title Pt will improve FGA score to at least a 25/30 in order to demo decr fall risk.    Baseline 21/30, 24/30    Time 6    Period Weeks    Status Not Met      PT LONG TERM GOAL #4   Title Pt will improve condition 4 of mCTSIB to at least 10 seconds to demo improved vestibular input for balance.    Baseline 2 seconds, 30 seconds on 11/27/20; 30 secs on 12/02/20    Time 6    Period Weeks    Status Achieved      PT LONG TERM GOAL #5   Title Pt will ambulate at least 500' outdoors over unlevel surfaces with supervision in order to demo improved community mobility.    Baseline 800+ ft outdoors on pavement/grass supervision    Time 6    Period Weeks    Status  Achieved                 Plan - 12/02/20 1501    Clinical Impression Statement Today's skilled PT session focused on beginning to assess progress toward LTG. Patient able to meet LTG #4 and 5, and demonstrating progress toward LTG #3 with score of 24/30 on FGA. Patient able to ambulate on unlevel outdoor surfaces with supervision overall. Patient demonstrating improvements in balance with ability to hold situation 4 of M-CTSIB for full 30 seconds. Rest of session focused on balance activites on complaint surfaces. Will finish assesment of LTG at next scheduled visit.    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), past medical history of PE (in 2016, off DOAC) hypercholesteremia, hypertension    Examination-Activity Limitations Locomotion Level;Stairs;Transfers    Examination-Participation Restrictions Occupation;Community Activity;Yard Work   Astronomer  Making Evolving/Moderate complexity    Rehab Potential Good    PT Frequency 2x / week    PT Duration 6 weeks    PT Treatment/Interventions ADLs/Self Care Home Management;Gait training;Stair training;Functional mobility training;Therapeutic activities;Therapeutic exercise;Balance training;Neuromuscular re-education;Patient/family education;Vestibular;Visual/perceptual remediation/compensation    PT Next Visit Plan Finish assessing LTG, decide if D/C or re-cert    Consulted and Agree with Plan of Care Patient   wife [name]          Patient will benefit from skilled therapeutic intervention in order to improve the following deficits and impairments:  Abnormal gait,Decreased balance,Decreased activity tolerance,Decreased coordination,Decreased strength,Impaired vision/preception,Difficulty walking,Dizziness  Visit Diagnosis: Unsteadiness on feet  Other abnormalities of gait and mobility  Muscle weakness  (generalized)  Other lack of coordination  Difficulty in walking, not elsewhere classified     Problem List There are no problems to display for this patient.   Jones Bales, PT, DPT 12/02/2020, 3:06 PM  [facility] [address] [place], [place], [address] Phone: [phone]   Fax:  [phone]  Name: [name] MRN: [id number] Date of Birth: 26-Nov-1960

## 2020-12-04 ENCOUNTER — Encounter: Payer: Self-pay | Admitting: Speech Pathology

## 2020-12-04 ENCOUNTER — Ambulatory Visit: Payer: 59 | Admitting: Physical Therapy

## 2020-12-04 ENCOUNTER — Ambulatory Visit: Payer: 59 | Admitting: Occupational Therapy

## 2020-12-04 ENCOUNTER — Ambulatory Visit: Payer: 59 | Admitting: Speech Pathology

## 2020-12-04 ENCOUNTER — Other Ambulatory Visit: Payer: Self-pay

## 2020-12-04 DIAGNOSIS — R471 Dysarthria and anarthria: Secondary | ICD-10-CM

## 2020-12-04 DIAGNOSIS — M25511 Pain in right shoulder: Secondary | ICD-10-CM

## 2020-12-04 DIAGNOSIS — R262 Difficulty in walking, not elsewhere classified: Secondary | ICD-10-CM

## 2020-12-04 DIAGNOSIS — R41840 Attention and concentration deficit: Secondary | ICD-10-CM

## 2020-12-04 DIAGNOSIS — R41844 Frontal lobe and executive function deficit: Secondary | ICD-10-CM

## 2020-12-04 DIAGNOSIS — R2689 Other abnormalities of gait and mobility: Secondary | ICD-10-CM

## 2020-12-04 DIAGNOSIS — R278 Other lack of coordination: Secondary | ICD-10-CM

## 2020-12-04 DIAGNOSIS — M6281 Muscle weakness (generalized): Secondary | ICD-10-CM

## 2020-12-04 DIAGNOSIS — R2681 Unsteadiness on feet: Secondary | ICD-10-CM

## 2020-12-04 DIAGNOSIS — R41842 Visuospatial deficit: Secondary | ICD-10-CM

## 2020-12-04 NOTE — Patient Instructions (Signed)
Access Code: [email or web address] URL: [email or web address] Date: 12/04/2020 Prepared by: Sherlie Ban  Exercises Standing Single Leg Stance with Counter Support - 1 x daily - 7 x weekly - 3 sets - 10-15 hold Tandem Walking with Counter Support - 2 x daily - 5 x weekly - 3 sets Standing Marching - 2 x daily - 5 x weekly - 3 sets Tandem Stance with Support - 2 x daily - 5 x weekly - 2 sets - 5 reps Romberg Stance Eyes Closed on Foam Pad - 1 x daily - 5 x weekly - 3 sets - 30 hold Romberg Stance on Foam Pad with Head Rotation - 1 x daily - 5 x weekly - 2 sets - 10 reps

## 2020-12-04 NOTE — Therapy (Signed)
[facility] [address] [place], [place], [address] Phone: [phone]   Fax:  [phone]  Occupational Therapy Treatment  Patient Details  Name: [name] MRN: [id number] Date of Birth: [birth year] No data recorded  Encounter Date: 12/04/2020   OT End of Session - 12/04/20 1323    Visit Number 7    Number of Visits 13    Date for OT Re-Evaluation 12/09/20    Authorization Type [employer]    Authorization Time Period VL 30 combined PT/OT    OT Start Time 1321    OT Stop Time 1401    OT Time Calculation (min) 40 min    Activity Tolerance Patient tolerated treatment well    Behavior During Therapy [facility] for tasks assessed/performed           No past medical history on file.  No past surgical history on file.  There were no vitals filed for this visit.                 OT Treatments/Exercises (OP) - 12/04/20 1323      Cognitive Exercises   Sequencing Skills repeating a pattern on constant therapy with 100% accuracy on level 1. Pt completed putting steps in order for sequencing with min difficulty.    Other Cognitive Exercises 1 reading a map on constant therapy level 1 with 90% accuracy.      Neurological Re-education Exercises   Other Exercises 1 motor planning activities with walking with ball, tossing ball, and dribbling unilaterally and alternating. Pt with max difficulty with multi step (walking, then tossing ball up)      Functional Reaching Activities   High Level Resistance Clothespins 1-8# with RUE and placing on antenna for increase in RUE control and grip strength and shoulder range of motion and functional reach      Fine Motor Coordination (Hand/Wrist)   Fine Motor Coordination In hand manipuation training    In Hand Manipulation Training large and small dice in RUE with no difficulty                    OT Short Term Goals - 11/20/20 1032      OT SHORT TERM GOAL  #1   Title Pt will be independent with HEP    Time 3    Period Weeks    Status On-going    Target Date 12/09/20      OT SHORT TERM GOAL #2   Title Pt will demonstrate increased fine motor coordination as evidenced by increasing 9 hole peg test score by at least 5 seconds with RUE.    Baseline RUE 60.16    Time 3    Period Weeks    Status On-going      OT SHORT TERM GOAL #3   Title Pt will complete environmental scanning with 80% accuracy with partial occlusion PRN and decrease in reports of diplopia.    Time 3    Period Weeks    Status Achieved   80%     OT SHORT TERM GOAL #4   Title Pt will verbalize understanding of visual strategies for diplopia other visual disturbances    Time 3    Period Weeks    Status New      OT SHORT TERM GOAL #5   Title Pt will increase range of motion in RUE shoulder to at least 120 degrees in order to obtain item from overhead shelf with pain less  than or equal to 6/10    Baseline pain 7/10 115 degrees    Time 3    Period Weeks    Status New             OT Long Term Goals - 10/28/20 1534      OT LONG TERM GOAL #1   Title Pt will be independent with updated HEP 12/09/2020    Time 6    Period Weeks    Status New    Target Date 12/09/20      OT LONG TERM GOAL #2   Title Pt will report increase ease with use of hand held tools (i.e. hammer) with RUE, dominant hand and with mod I    Time 6    Period Weeks    Status New      OT LONG TERM GOAL #3   Title Pt will demonstrate increased fine motor coordination with increased 9 hole peg test score of at least 10 seconds    Baseline RUE 60.16    Time 6    Period Weeks    Status New      OT LONG TERM GOAL #4   Title Pt will perform Box and Blocks test of 32 blocks or more with RUE for increase in functional use.    Baseline RUE 27    Time 6    Period Weeks    Status New      OT LONG TERM GOAL #5   Title Pt will perform handwriting of one sentence with legibility of 90% and in  appropriate time with right, dominant, hand.    Baseline 50% legibility with increased time    Time 6    Period Weeks    Status New      OT LONG TERM GOAL #6   Title Pt will perform environmental scanning with 100% accuracy with partial occlusion PRN and no reports or complaints of diplopia    Time 6    Period Weeks    Status New                 Plan - 12/04/20 1413    Clinical Impression Statement Pt is making steady progress. Pt presenting with significant apraxia today.    OT Occupational Profile and History Detailed Assessment- Review of Records and additional review of physical, cognitive, psychosocial history related to current functional performance    Occupational performance deficits (Please refer to evaluation for details): IADL's;ADL's;Social Participation;Rest and Sleep;Leisure;Education    Body Structure / Function / Physical Skills ADL;Balance;Coordination;FMC;Flexibility;Dexterity;Strength;Pain;GMC;UE functional use;ROM;IADL;Vision    Cognitive Skills Attention;Problem Solve;Thought;Learn;Understand;Perception    Rehab Potential Fair    Clinical Decision Making Limited treatment options, no task modification necessary    Comorbidities Affecting Occupational Performance: None    Modification or Assistance to Complete Evaluation  No modification of tasks or assist necessary to complete eval    OT Frequency 2x / week    OT Duration 6 weeks   or 12 visits + eval over extended time d/t scheduling conflicts   OT Treatment/Interventions Balance training;Moist Heat;Fluidtherapy;Self-care/ADL training;Therapeutic activities;Cognitive remediation/compensation;Therapeutic exercise;Electrical Stimulation;Cryotherapy;Energy conservation;Neuromuscular education;Functional Mobility Training;Passive range of motion;Visual/perceptual remediation/compensation;Patient/family education;Manual Therapy    Plan continue to assess current vision with R eye and status of eye opening. RUE  coordination    Recommended Other Services Pt has referral for ST and is currently receiving PT    Consulted and Agree with Plan of Care Patient;Family member/caregiver    Family Member Consulted spouse, Misty Stanley  Patient will benefit from skilled therapeutic intervention in order to improve the following deficits and impairments:   Body Structure / Function / Physical Skills: ADL,Balance,Coordination,FMC,Flexibility,Dexterity,Strength,Pain,GMC,UE functional use,ROM,IADL,Vision Cognitive Skills: Attention,Problem Solve,Thought,Learn,Understand,Perception     Visit Diagnosis: Muscle weakness (generalized)  Frontal lobe and executive function deficit  Visuospatial deficit  Attention and concentration deficit  Other lack of coordination  Acute pain of right shoulder    Problem List There are no problems to display for this patient.   Osborne Casco Jayin Derousse MOT, OTR/L  12/04/2020, 2:15 PM  [facility] [address] [place], [place], [address] Phone: [phone]   Fax:  [phone]  Name: Siddarth Hsiung MRN: [id number] Date of Birth: August 09, 1961

## 2020-12-04 NOTE — Patient Instructions (Signed)
   Add tongue depressor hold - hold it out straight 10 seconds then rest 15x twice a day  Add eyebrow raises in mirror if you want  You are doing a great job using slow speech and over enunciation. I know it is frustrating.  Taking a big breath before you speak helps get good volume  If you are asked to repeat yourself, take a big breath to get volume

## 2020-12-04 NOTE — Therapy (Signed)
[facility] [address] [place], [place], [address] Phone: [phone]   Fax:  [phone]  Speech Language Pathology Treatment  Patient Details  Name: [name] MRN: [id number] Date of Birth: 1961/07/25 Referring Provider (SLP): Nelwyn Salisbury, MD   Encounter Date: 12/04/2020   End of Session - 12/04/20 1312    Visit Number 4    Number of Visits 13    Date for SLP Re-Evaluation 02/17/21    SLP Start Time 1235    SLP Stop Time  1311    SLP Time Calculation (min) 36 min    Activity Tolerance Patient tolerated treatment well           History reviewed. No pertinent past medical history.  History reviewed. No pertinent surgical history.  There were no vitals filed for this visit.   Subjective Assessment - 12/04/20 1241    Subjective "I think its the same as before the surgery"    Currently in Pain? Yes    Pain Score 4     Pain Location Shoulder    Pain Orientation Right    Pain Descriptors / Indicators Aching;Sharp    Pain Type Chronic pain                 ADULT SLP TREATMENT - 12/04/20 1242      General Information   Behavior/Cognition Alert;Cooperative;Pleasant mood      Treatment Provided   Treatment provided Cognitive-Linquistic      Cognitive-Linquistic Treatment   Treatment focused on Dysarthria;Patient/family/caregiver education    Skilled Treatment [name] reports he can't hold air in his cheeck - added to HEP tongue depressor hold with right labial seal. HEP with mod I. [name] carried over compensations in conversation with rare min A. Added some noise to environment, pt remained intellgilble with rare min A      Assessment / Recommendations / Plan   Plan Continue with current plan of care   decrease to 1x a week     Dysphagia Recommendations   Diet recommendations Dysphagia 3 (mechanical soft);Thin liquid    Liquids provided via Cup;Straw    Supervision Patient able to  self feed    Compensations Slow rate;Small sips/bites;Check for pocketing      Progression Toward Goals   Progression toward goals Progressing toward goals            SLP Education - 12/04/20 1303    Education Details added to HEP    Person(s) Educated Patient    Methods Explanation;Demonstration;Verbal cues;Handout    Comprehension Verbalized understanding;Returned demonstration            SLP Short Term Goals - 12/04/20 1305      SLP SHORT TERM GOAL #1   Title pt will complete speech HEP for incr'd strength with rare min A    Time 1    Period Weeks    Status Achieved      SLP SHORT TERM GOAL #2   Title pt will undergo bedside swallow eval    Time 2    Period Weeks    Status Achieved      SLP SHORT TERM GOAL #3   Title pt will ube scheduled for objective swallow eval, if clinicaly indicated    Time 2    Period Weeks    Status Deferred            SLP Long Term Goals - 12/04/20 1306      SLP LONG  TERM GOAL #1   Title pt will perform HEP for oral motor muscle strength (labial structures) with modified independence in 3 sessions    Time 5    Period Weeks   or 13 total visits   Status On-going      SLP LONG TERM GOAL #2   Title pt will demo speech compensations 75% of the time in order to maintain 100% intelligibility in 15 minutes mod complex conversation in 3 sessions    Time 5    Period Weeks    Status On-going      SLP LONG TERM GOAL #3   Title in last ST session, pt will report decr'd speech related QOL than in first therapy session    Time 5    Period Weeks    Status On-going            Plan - 12/04/20 1311    Clinical Impression Statement Right side of face remains flaccid, with some regain of sensation in right forehead and right submandibular regoin. Ongoing training in HEP and compensatory strategies to maximize intellgibility. No overt s/s of aspiration with hard solid and thin liquids. Pt spontaneously cleared right buccal residue with  liquid sip. Denies difficulty swallowing pills. Continue skilled ST to maximize intelligibility. Reduced to 1x a week for 1-3 more visits.    Speech Therapy Frequency 1x /week    Duration 8 weeks   17 weeks   Treatment/Interventions Aspiration precaution training;Pharyngeal strengthening exercises;Trials of upgraded texture/liquids;Diet toleration management by SLP;Cueing hierarchy;Patient/family education;Internal/external aids;Compensatory strategies;SLP instruction and feedback;Functional tasks    Potential to Achieve Goals Good           Patient will benefit from skilled therapeutic intervention in order to improve the following deficits and impairments:   Dysarthria and anarthria    Problem List There are no problems to display for this patient.   [name], Radene Journey MS, CCC-SLP 12/04/2020, 1:12 PM  [facility] [address] [place], [place], [address] Phone: [phone]   Fax:  [phone]   Name: [name] MRN: [id number] Date of Birth: [birth year]

## 2020-12-04 NOTE — Therapy (Signed)
[facility] [address] [place], [place], [address] Phone: [phone]   Fax:  [phone]  Physical Therapy Treatment/Discharge Summary  Patient Details  Name: [name] MRN: [id number] Date of Birth: 05-15-1961 Referring Provider (PT): Nelwyn Salisbury, [place]   Encounter Date: 12/04/2020   PT End of Session - 12/04/20 1456    Visit Number 12    Number of Visits 13    Authorization Type [employer] - 30 VL between PT and OT (will start over at beginning of year)    Authorization - Visit Number 12    Authorization - Number of Visits 30    PT Start Time 1402    PT Stop Time 1445    PT Time Calculation (min) 43 min    Equipment Utilized During Treatment Gait belt    Activity Tolerance Patient tolerated treatment well    Behavior During Therapy [facility] for tasks assessed/performed           No past medical history on file.  No past surgical history on file.  There were no vitals filed for this visit.   Subjective Assessment - 12/04/20 1404    Subjective Reports balance is getting better. Biggest challenge now is fine motor tasks with R hand.    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    Currently in Pain? Yes    Pain Score 3     Pain Location Shoulder    Pain Orientation Right    Pain Descriptors / Indicators Aching;Sharp    Pain Type Chronic pain    Pain Onset More than a month ago    Aggravating Factors  moving it the wrong way    Pain Relieving Factors nothing              NMR:     Neuro re-ed: sensory organization test performed with following results: Conditions: 1: all 3 trials above age related norms  2:  all 3 trials above age  related norms  3:  all 3 trials above age related norms   4: all 3 trials above age related norms   5: all 3 trials above age related norms   6: first trial fall, 2nd above normal, 3rd below normal Composite score: 74 Sensory Analysis Som: ~95 Vis: ~90 Vest: ~70 Pref: 85   Strategy analysis: mix of ankle/hip strategy, primarily hip dominant during conditions 5 and 6       COG alignment: WFL, COG towards left and anteriorly         Access Code: [email or web address] URL: [email or web address] Date: 12/04/2020 Prepared by: Sherlie Ban   Finalized and reviewed HEP. See MedBridge for more details.  Exercises Standing Single Leg Stance with Counter Support - 1 x daily - 7 x weekly - 3 sets - 10-15 hold Tandem Walking with Counter Support - 2 x daily - 5 x weekly - 3 sets Standing Marching - 2 x daily - 5 x weekly - 3 sets Tandem Stance with Support - 2 x daily - 5 x weekly - 2 sets - 5 reps Romberg Stance Eyes Closed on Foam Pad - 1 x daily - 5 x weekly - 3 sets - 30 hold Romberg Stance on Foam  Pad with Head Rotation - 1 x daily - 5 x weekly - 2 sets - 10 reps             PT Education - 12/04/20 1455    Education Details results of SOT, reviewed and finalized HEP.    Person(s) Educated Patient    Methods Explanation;Demonstration;Handout    Comprehension Verbalized understanding;Returned demonstration            PT Short Term Goals - 11/13/20 1539      PT SHORT TERM GOAL #1   Title Pt will be independent with initial HEP in order to build upon functional gains made in therapy. ALL STGS DUE 11/14/20    Baseline met, upgraded on 11/11/20    Time 3    Period Weeks    Status Achieved    Target Date 11/14/20      PT SHORT TERM GOAL #2   Title Pt will undergo further assessment of SOT - with LTG written as appropriate.    Baseline achieved on 12/22/1 - composite score 70 and vestibular score ~30    Time 3    Period Weeks    Status Achieved      PT  SHORT TERM GOAL #3   Title Pt will improve FGA score to at least a 23/30 in order to demo decr fall risk.    Baseline 21/30, 23/30 on 11/11/20    Time 3    Period Weeks    Status Achieved      PT SHORT TERM GOAL #4   Title Pt will improve 5x sit <> stand to 13 seconds or less in order to demo improved BLE strength.    Baseline 16.34 seconds, 12.75 seconds on 11/11/20    Time 3    Period Weeks    Status Achieved      PT SHORT TERM GOAL #5   Title Pt will perform 8 steps with no handrail and reciprocal pattern with supervision in order to demo improved BLE strength and balance    Baseline met on 11/11/20    Period Weeks    Status Achieved             PT Long Term Goals - 12/04/20 1427      PT LONG TERM GOAL #1   Title Pt will be independent with final HEP in order to build upon functional gains made in therapy ALL LTGS DUE 12/05/20    Baseline reviewed HEP on 12/04/20    Time 6    Period Weeks    Status Achieved      PT LONG TERM GOAL #2   Title Pt will improve SOT score for vestibular for sensory analysis to at least 50 in order to demo improved vestibular input for balance.    Baseline 30 on 11/13/20, 70 on 12/04/20    Time 6    Period Weeks    Status Achieved      PT LONG TERM GOAL #3   Title Pt will improve FGA score to at least a 25/30 in order to demo decr fall risk.    Baseline 21/30, 24/30    Time 6    Period Weeks    Status Not Met      PT LONG TERM GOAL #4   Title Pt will improve condition 4 of mCTSIB to at least 10 seconds to demo improved vestibular input for balance.    Baseline 2 seconds, 30 seconds on 11/27/20; 30 secs on 12/02/20  Time 6    Period Weeks    Status Achieved      PT LONG TERM GOAL #5   Title Pt will ambulate at least 500' outdoors over unlevel surfaces with supervision in order to demo improved community mobility.    Baseline 800+ ft outdoors on pavement/grass supervision    Time 6    Period Weeks    Status Achieved              PHYSICAL THERAPY DISCHARGE SUMMARY  Visits from Start of Care: 12  Current functional level related to goals / functional outcomes: See LTGs.    Remaining deficits: Impaired RLE SLS, visual impairments with R eye (OT addressing)   Education / Equipment: HEP  Plan: Patient agrees to discharge.  Patient goals were met. Patient is being discharged due to meeting the stated rehab goals.  ?????          Plan - 12/04/20 1500    Clinical Impression Statement Today's skilled session focused on assessing remainder of LTGs. Performed the SOT again today with pt meeting LTG #2 - improved SOT score for vestibular for sensory analysis to 70 (previously 30). Pt demonstrating above age related norms for this category. Remainder of session focused on reviewing and finalizing HEP. Pt reports improvements in balance and due to achievements in LTGs will D/C at this time. Pt in agreement at this time.    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), past medical history of PE (in 2016, off DOAC) hypercholesteremia, hypertension    Examination-Activity Limitations Locomotion Level;Stairs;Transfers    Examination-Participation Restrictions Occupation;Community Activity;Yard Work   Community education officer Evolving/Moderate complexity    Rehab Potential Good    PT Frequency 2x / week    PT Duration 6 weeks    PT Treatment/Interventions ADLs/Self Care Home Management;Gait training;Stair training;Functional mobility training;Therapeutic activities;Therapeutic exercise;Balance training;Neuromuscular re-education;Patient/family education;Vestibular;Visual/perceptual remediation/compensation    PT Next Visit Plan D/C    Consulted and Agree with Plan of Care Patient   wife Marzetta Board          Patient will benefit from skilled therapeutic intervention in order to improve the  following deficits and impairments:  Abnormal gait,Decreased balance,Decreased activity tolerance,Decreased coordination,Decreased strength,Impaired vision/preception,Difficulty walking,Dizziness  Visit Diagnosis: Muscle weakness (generalized)  Unsteadiness on feet  Other lack of coordination  Other abnormalities of gait and mobility  Difficulty in walking, not elsewhere classified     Problem List There are no problems to display for this patient.   Arliss Journey, PT, DPT  12/04/2020, 3:02 PM  [facility] [address] [place], [place], [address] Phone: [phone]   Fax:  [phone]  Name: Kenyatta Keidel MRN: [id number] Date of Birth: May 17, 1961

## 2020-12-09 ENCOUNTER — Ambulatory Visit: Payer: 59

## 2020-12-09 ENCOUNTER — Ambulatory Visit: Payer: 59 | Admitting: Occupational Therapy

## 2020-12-11 ENCOUNTER — Ambulatory Visit: Payer: 59 | Admitting: Speech Pathology

## 2020-12-11 ENCOUNTER — Other Ambulatory Visit: Payer: Self-pay

## 2020-12-11 ENCOUNTER — Ambulatory Visit: Payer: 59 | Admitting: Occupational Therapy

## 2020-12-11 ENCOUNTER — Encounter: Payer: Self-pay | Admitting: Speech Pathology

## 2020-12-11 ENCOUNTER — Encounter: Payer: 59 | Admitting: Speech Pathology

## 2020-12-11 DIAGNOSIS — R1310 Dysphagia, unspecified: Secondary | ICD-10-CM

## 2020-12-11 DIAGNOSIS — R2681 Unsteadiness on feet: Secondary | ICD-10-CM | POA: Diagnosis not present

## 2020-12-11 DIAGNOSIS — R471 Dysarthria and anarthria: Secondary | ICD-10-CM

## 2020-12-11 NOTE — Therapy (Signed)
[facility] [address] [place], [place], [address] Phone: [phone]   Fax:  [phone]  Speech Language Pathology Treatment  Patient Details  Name: [name] MRN: [id number] Date of Birth: [birth year] Referring Provider (SLP): Nelwyn Salisbury, MD   Encounter Date: 12/11/2020   End of Session - 12/11/20 1313    Visit Number 5    Number of Visits 13    Date for SLP Re-Evaluation 02/17/21    SLP Start Time 1235    SLP Stop Time  1314    SLP Time Calculation (min) 39 min    Activity Tolerance Patient tolerated treatment well           History reviewed. No pertinent past medical history.  History reviewed. No pertinent surgical history.  There were no vitals filed for this visit.   Subjective Assessment - 12/11/20 1238    Subjective "He told me it would take 6 months and it's only been 2" re: facial sensation    Currently in Pain? Yes    Pain Score 4     Pain Location Shoulder    Pain Orientation Right    Pain Descriptors / Indicators Aching;Sharp    Pain Type Chronic pain    Pain Onset More than a month ago    Pain Frequency Constant                 ADULT SLP TREATMENT - 12/11/20 1239      General Information   Behavior/Cognition Alert;Cooperative;Pleasant mood      Treatment Provided   Treatment provided Cognitive-Linquistic      Cognitive-Linquistic Treatment   Treatment focused on Dysarthria;Patient/family/caregiver education    Skilled Treatment [name] reports fatigue with talking and reports his energy is reduced after about 15 minutes of activity. He is exercising regularly and doing yard work. He reports his wife understands him consistently when they are face to face. HEP completed consistently, without change. Pt 100% intelligible in sentence generation in structured task and carries over strategies of slow rate and over articulation with mod I. Moderately complex  conversation in mildly noisy environment pt intelligible.      Assessment / Recommendations / Plan   Plan Continue with current plan of care      Progression Toward Goals   Progression toward goals Progressing toward goals              SLP Short Term Goals - 12/11/20 1312      SLP SHORT TERM GOAL #1   Title pt will complete speech HEP for incr'd strength with rare min A    Time 1    Period Weeks    Status Achieved      SLP SHORT TERM GOAL #2   Title pt will undergo bedside swallow eval    Time 2    Period Weeks    Status Achieved      SLP SHORT TERM GOAL #3   Title pt will ube scheduled for objective swallow eval, if clinicaly indicated    Time 2    Period Weeks    Status Deferred            SLP Long Term Goals - 12/11/20 1312      SLP LONG TERM GOAL #1   Title pt will perform HEP for oral motor muscle strength (labial structures) with modified independence in 3 sessions    Time 4    Period Weeks   or 13  total visits   Status On-going      SLP LONG TERM GOAL #2   Title pt will demo speech compensations 75% of the time in order to maintain 100% intelligibility in 15 minutes mod complex conversation in 3 sessions    Time 4    Period Weeks    Status On-going      SLP LONG TERM GOAL #3   Title in last ST session, pt will report decr'd speech related QOL than in first therapy session    Time 4    Period Weeks    Status On-going            Plan - 12/11/20 1311    Clinical Impression Statement Right side of face remains flaccid, with some regain of sensation in right forehead and right submandibular regoin. Ongoing training in HEP and compensatory strategies to maximize intellgibility. No overt s/s of aspiration with hard solid and thin liquids. Pt spontaneously cleared right buccal residue with liquid sip. Denies difficulty swallowing pills. Continue skilled ST to maximize intelligibility. Reduced to 1x a week for 1-3 more visits.    Speech Therapy Frequency  1x /week    Duration 8 weeks    Treatment/Interventions Aspiration precaution training;Pharyngeal strengthening exercises;Trials of upgraded texture/liquids;Diet toleration management by SLP;Cueing hierarchy;Patient/family education;Internal/external aids;Compensatory strategies;SLP instruction and feedback;Functional tasks    Potential to Achieve Goals Good           Patient will benefit from skilled therapeutic intervention in order to improve the following deficits and impairments:   Dysarthria and anarthria  Dysphagia, unspecified type    Problem List There are no problems to display for this patient.   [name], Radene Journey MS, CCC-SLP 12/11/2020, 1:14 PM  [facility] [address] [place], [place], [address] Phone: [phone]   Fax:  [phone]   Name: [name] MRN: [id number] Date of Birth: [birth year]

## 2020-12-13 ENCOUNTER — Ambulatory Visit: Payer: 59 | Admitting: Occupational Therapy

## 2020-12-16 ENCOUNTER — Ambulatory Visit: Payer: 59 | Admitting: Speech Pathology

## 2020-12-16 ENCOUNTER — Ambulatory Visit: Payer: 59 | Admitting: Occupational Therapy

## 2020-12-18 ENCOUNTER — Encounter: Payer: 59 | Admitting: Speech Pathology

## 2020-12-18 ENCOUNTER — Ambulatory Visit: Payer: 59 | Admitting: Occupational Therapy

## 2020-12-23 ENCOUNTER — Ambulatory Visit: Payer: 59 | Admitting: Speech Pathology

## 2020-12-23 ENCOUNTER — Ambulatory Visit: Payer: 59 | Admitting: Occupational Therapy

## 2022-05-18 ENCOUNTER — Other Ambulatory Visit: Payer: Self-pay | Admitting: Internal Medicine

## 2022-05-18 DIAGNOSIS — R1310 Dysphagia, unspecified: Secondary | ICD-10-CM

## 2022-06-10 ENCOUNTER — Ambulatory Visit: Payer: Self-pay

## 2023-08-20 LAB — EXTERNAL GENERIC LAB PROCEDURE: COLOGUARD: NEGATIVE
